# Patient Record
Sex: Female | Born: 1990 | Hispanic: Yes | Marital: Married | Smoking: Never smoker
Health system: Southern US, Community
[De-identification: ages and names within clinical notes are randomized; demographics above are authoritative.]

## PROBLEM LIST (undated history)

## (undated) DIAGNOSIS — Z789 Other specified health status: Secondary | ICD-10-CM

## (undated) HISTORY — PX: NO PAST SURGERIES: SHX2092

---

## 2016-07-19 NOTE — L&D Delivery Note (Signed)
Delivery Note At 2:59 AM a viable female was delivered via Vaginal, Spontaneous Delivery (Presentation:cephalic ; ROA  ).  APGAR: 8, 9; weight pending .   Placenta status: grossly normal , intact .  Cord: 3 vessel with the following no complications: .  Cord pH: not collected due to normal delivery.  Anesthesia:  epidural Episiotomy:  none Lacerations: 2nd degree Suture Repair: vicryl Est. Blood Loss (mL):  250  Mom to postpartum.  Baby to Couplet care / Skin to Skin.  Julieanne Cotton, Medical Student with [name], MD 03/15/2017, 3:28 AM

## 2016-10-25 LAB — OB RESULTS CONSOLE VARICELLA ZOSTER ANTIBODY, IGG: Varicella: IMMUNE

## 2016-10-25 LAB — OB RESULTS CONSOLE RPR: RPR: NONREACTIVE

## 2016-10-25 LAB — OB RESULTS CONSOLE HGB/HCT, BLOOD
HCT: 34 %
Hemoglobin: 11 g/dL

## 2016-10-25 LAB — CYTOLOGY - PAP
CYSTIC FIBROSIS PROFILE: NEGATIVE
Drug Screen, Urine: NEGATIVE
GLUCOSE 1 HR PRENATAL, POC: 131 mg/dL
Pap: NEGATIVE
URINE CULTURE, OB: NEGATIVE

## 2016-10-25 LAB — OB RESULTS CONSOLE HEPATITIS B SURFACE ANTIGEN: Hepatitis B Surface Ag: NEGATIVE

## 2016-10-25 LAB — OB RESULTS CONSOLE PLATELET COUNT: PLATELETS: 309 10*3/uL

## 2016-10-25 LAB — OB RESULTS CONSOLE ANTIBODY SCREEN: Antibody Screen: NEGATIVE

## 2016-10-25 LAB — OB RESULTS CONSOLE ABO/RH: RH TYPE: POSITIVE

## 2016-10-25 LAB — OB RESULTS CONSOLE RUBELLA ANTIBODY, IGM: RUBELLA: IMMUNE

## 2016-10-25 LAB — OB RESULTS CONSOLE GC/CHLAMYDIA
Chlamydia: NEGATIVE
Gonorrhea: NEGATIVE

## 2016-10-25 LAB — OB RESULTS CONSOLE HIV ANTIBODY (ROUTINE TESTING): HIV: NONREACTIVE

## 2016-10-28 ENCOUNTER — Other Ambulatory Visit: Payer: Self-pay | Admitting: Obstetrics and Gynecology

## 2016-10-28 DIAGNOSIS — O4402 Placenta previa specified as without hemorrhage, second trimester: Secondary | ICD-10-CM

## 2016-10-28 DIAGNOSIS — Z3689 Encounter for other specified antenatal screening: Secondary | ICD-10-CM

## 2016-10-28 DIAGNOSIS — Z3A21 21 weeks gestation of pregnancy: Secondary | ICD-10-CM

## 2016-10-28 DIAGNOSIS — O26872 Cervical shortening, second trimester: Secondary | ICD-10-CM

## 2016-11-04 ENCOUNTER — Encounter: Payer: Self-pay | Admitting: *Deleted

## 2016-11-04 ENCOUNTER — Ambulatory Visit (INDEPENDENT_AMBULATORY_CARE_PROVIDER_SITE_OTHER): Payer: Medicaid Other | Admitting: Family Medicine

## 2016-11-04 ENCOUNTER — Encounter (HOSPITAL_COMMUNITY): Payer: Self-pay

## 2016-11-04 VITALS — BP 120/68 | HR 78 | Ht 62.0 in | Wt 141.7 lb

## 2016-11-04 DIAGNOSIS — O0992 Supervision of high risk pregnancy, unspecified, second trimester: Secondary | ICD-10-CM

## 2016-11-04 DIAGNOSIS — O26872 Cervical shortening, second trimester: Secondary | ICD-10-CM | POA: Diagnosis not present

## 2016-11-04 DIAGNOSIS — O0990 Supervision of high risk pregnancy, unspecified, unspecified trimester: Secondary | ICD-10-CM | POA: Insufficient documentation

## 2016-11-04 DIAGNOSIS — O26879 Cervical shortening, unspecified trimester: Secondary | ICD-10-CM

## 2016-11-04 MED ORDER — PROGESTERONE MICRONIZED 100 MG PO CAPS: 200.0000 mg | ORAL_CAPSULE | Freq: Every day | ORAL | 6 refills | Status: DC

## 2016-11-04 NOTE — Addendum Note (Signed)
Addended by: Sherre Lain A on: 11/04/2016 03:12 PM   Modules accepted: Orders

## 2016-11-04 NOTE — Progress Notes (Signed)
   PRENATAL VISIT NOTE  Subjective:  Sara Price is a 26 y.o. G1P0 at [redacted]w[redacted]d being seen today for Initial prenatal care. She was initially seen at [facility] and was transferred here after US showed cervical length of 2.9 with funneling. She was started on prometrium  PO BID. She is currently monitored for the following issues for this high-risk pregnancy and has Supervision of high risk pregnancy, antepartum and Short cervix affecting pregnancy on her problem list.  Patient reports no complaints.  Contractions: Not present. Vag. Bleeding: None.  Movement: Present. Denies leaking of fluid.   The following portions of the patient's history were reviewed and updated as appropriate: allergies, current medications, past family history, past medical history, past social history, past surgical history and problem list. Problem list updated.  Objective:   Vitals:   11/04/16 1420 11/04/16 1425  BP: 120/68   Pulse: 78   Weight: 141 lb 11.2 oz (64.3 kg)   Height:   (1.575 m)    Fetal Status: Fetal Heart Rate (bpm): 144   Movement: Present     General:  Alert, oriented and cooperative. Patient is in no acute distress.  Skin: Skin is warm and dry. No rash noted.   Cardiovascular: Normal heart rate noted  Respiratory: Normal respiratory effort, no problems with respiration noted  Abdomen: Soft, gravid, appropriate for gestational age. Pain/Pressure: Absent     Pelvic:  Cervical exam deferred        Extremities: Normal range of motion.  Edema: None  Mental Status: Normal mood and affect. Normal behavior. Normal judgment and thought content.   Assessment and Plan:  Pregnancy: G1P0 at [redacted]w[redacted]d  1. Supervision of high risk pregnancy, antepartum FH and FHT normal.  2. Short cervix affecting pregnancy Has [place] tomorrow. prometrium  daily  Preterm labor symptoms and general obstetric precautions including but not limited to vaginal bleeding, contractions, leaking of fluid and fetal  movement were reviewed in detail with the patient. Please refer to After Visit Summary for other counseling recommendations.  Return in about 4 weeks (around 12/02/2016) for HR OB f/u.   Levie Heritage, DO

## 2016-11-05 ENCOUNTER — Encounter (HOSPITAL_COMMUNITY): Payer: Self-pay

## 2016-11-05 ENCOUNTER — Ambulatory Visit (HOSPITAL_COMMUNITY)
Admission: RE | Admit: 2016-11-05 | Discharge: 2016-11-05 | Disposition: A | Discharge status: Home / Self Care | Payer: Self-pay | Source: Ambulatory Visit | Attending: Nurse Practitioner | Admitting: Nurse Practitioner

## 2016-11-05 DIAGNOSIS — Z3A21 21 weeks gestation of pregnancy: Secondary | ICD-10-CM | POA: Insufficient documentation

## 2016-11-05 DIAGNOSIS — Z3689 Encounter for other specified antenatal screening: Secondary | ICD-10-CM | POA: Insufficient documentation

## 2016-11-05 DIAGNOSIS — O4402 Placenta previa specified as without hemorrhage, second trimester: Secondary | ICD-10-CM

## 2016-11-05 DIAGNOSIS — O26872 Cervical shortening, second trimester: Secondary | ICD-10-CM | POA: Insufficient documentation

## 2016-11-05 HISTORY — DX: Other specified health status: Z78.9

## 2016-11-05 NOTE — Addendum Note (Signed)
Encounter addended by: Levonne Hubert on: 11/05/2016  4:27 PM<BR>    Actions taken: Imaging Exam ended

## 2016-11-08 ENCOUNTER — Other Ambulatory Visit (HOSPITAL_COMMUNITY): Payer: Self-pay | Admitting: *Deleted

## 2016-11-08 DIAGNOSIS — O09219 Supervision of pregnancy with history of pre-term labor, unspecified trimester: Secondary | ICD-10-CM

## 2016-11-09 ENCOUNTER — Telehealth: Payer: Self-pay | Admitting: *Deleted

## 2016-11-09 LAB — AFP, QUAD SCREEN
DIA MOM VALUE: 0.73
DIA Value (EIA): 182.05 pg/mL
DSR (BY AGE) 1 IN: 930
DSR (SECOND TRIMESTER) 1 IN: 10000
Gestational Age: 21.4 WEEKS
MSAFP MOM: 1.11
MSAFP: 77.8 ng/mL
MSHCG Mom: 0.43
MSHCG: 10124 m[IU]/mL
Maternal Age At EDD: 26.9 yr
OSB RISK: 8647
T18 (By Age): 1:3625 {titer}
TEST RESULTS AFP: NEGATIVE
UE3 VALUE: 2.34 ng/mL
WEIGHT: 141 [lb_av]
uE3 Mom: 1.05

## 2016-11-09 NOTE — Telephone Encounter (Addendum)
-----   Message from Hermina Staggers, MD sent at 11/08/2016 12:21 PM EDT ----- Please schedule follow up U/S for CL 2 weeks from 11/05/16 Thanks Casimiro Needle  Per chart review, pt has scheduled appt for MFM [place] on 5/4 @ 1530.  Pt was called with [facility] interpreter # [phone].  Message was left on her voice mail and stated that she has next [place] on 5/4 @ 1530.  She may call back if she has questions.

## 2016-11-11 ENCOUNTER — Encounter: Payer: Self-pay | Admitting: *Deleted

## 2016-11-19 ENCOUNTER — Encounter (HOSPITAL_COMMUNITY): Payer: Self-pay

## 2016-11-19 ENCOUNTER — Ambulatory Visit (HOSPITAL_COMMUNITY)
Admission: RE | Admit: 2016-11-19 | Discharge: 2016-11-19 | Disposition: A | Discharge status: Home / Self Care | Payer: Self-pay | Source: Ambulatory Visit | Attending: Nurse Practitioner | Admitting: Nurse Practitioner

## 2016-11-19 DIAGNOSIS — O26872 Cervical shortening, second trimester: Secondary | ICD-10-CM | POA: Insufficient documentation

## 2016-11-19 DIAGNOSIS — Z3A23 23 weeks gestation of pregnancy: Secondary | ICD-10-CM | POA: Insufficient documentation

## 2016-11-19 DIAGNOSIS — O09212 Supervision of pregnancy with history of pre-term labor, second trimester: Secondary | ICD-10-CM | POA: Insufficient documentation

## 2016-11-19 DIAGNOSIS — O0990 Supervision of high risk pregnancy, unspecified, unspecified trimester: Secondary | ICD-10-CM

## 2016-11-19 DIAGNOSIS — O09219 Supervision of pregnancy with history of pre-term labor, unspecified trimester: Secondary | ICD-10-CM

## 2016-11-29 DIAGNOSIS — O0990 Supervision of high risk pregnancy, unspecified, unspecified trimester: Secondary | ICD-10-CM

## 2016-11-29 DIAGNOSIS — Z789 Other specified health status: Secondary | ICD-10-CM

## 2016-11-29 DIAGNOSIS — O4400 Placenta previa specified as without hemorrhage, unspecified trimester: Secondary | ICD-10-CM | POA: Insufficient documentation

## 2016-11-30 ENCOUNTER — Ambulatory Visit (INDEPENDENT_AMBULATORY_CARE_PROVIDER_SITE_OTHER): Payer: Medicaid Other | Admitting: Obstetrics and Gynecology

## 2016-11-30 VITALS — BP 103/58 | HR 61 | Wt 147.2 lb

## 2016-11-30 DIAGNOSIS — O0992 Supervision of high risk pregnancy, unspecified, second trimester: Secondary | ICD-10-CM | POA: Diagnosis present

## 2016-11-30 DIAGNOSIS — O0990 Supervision of high risk pregnancy, unspecified, unspecified trimester: Secondary | ICD-10-CM

## 2016-11-30 NOTE — Progress Notes (Signed)
Prenatal Visit Note Date: 11/30/2016 Clinic: [facility]  Subjective:  Sara Price is a 26 y.o. G1P0 at 6543w1d being seen today for ongoing prenatal care.  She is currently monitored for the following issues for this high-risk pregnancy and has Supervision of high risk pregnancy, antepartum and Language barrier on her problem list.  Patient reports no complaints.   Contractions: Not present. Vag. Bleeding: None.  Movement: Present. Denies leaking of fluid.   The following portions of the patient's history were reviewed and updated as appropriate: allergies, current medications, past family history, past medical history, past social history, past surgical history and problem list. Problem list updated.  Objective:   Vitals:   11/30/16 0819  BP: (!) 103/58  Pulse: 61  Weight: 147 lb 3.2 oz (66.8 kg)    Fetal Status: Fetal Heart Rate (bpm): 131   Movement: Present     General:  Alert, oriented and cooperative. Patient is in no acute distress.  Skin: Skin is warm and dry. No rash noted.   Cardiovascular: Normal heart rate noted  Respiratory: Normal respiratory effort, no problems with respiration noted  Abdomen: Soft, gravid, appropriate for gestational age. Pain/Pressure: Absent     Pelvic:  Cervical exam deferred        Extremities: Normal range of motion.  Edema: None  Mental Status: Normal mood and affect. Normal behavior. Normal judgment and thought content.   Urinalysis:      Assessment and Plan:  Pregnancy: G1P0 at 5743w1d  1. Supervision of high risk pregnancy, antepartum Normal CL x2. Told her only need PRN u/s from this point forward but would keep taking the prometrium. 28wk labs nv.   Interpreter used.   Preterm labor symptoms and general obstetric precautions including but not limited to vaginal bleeding, contractions, leaking of fluid and fetal movement were reviewed in detail with the patient. Please refer to After Visit Summary for  other counseling recommendations.  Return in about 2 weeks (around 12/14/2016) for 2-3wk rob with 2hr gtt.   [place] BingPickens, Zaide Kardell, MD

## 2016-11-30 NOTE — Progress Notes (Signed)
Video Interpreter # [id number] Ana

## 2016-12-01 ENCOUNTER — Encounter: Payer: Self-pay | Admitting: *Deleted

## 2016-12-06 DIAGNOSIS — O0990 Supervision of high risk pregnancy, unspecified, unspecified trimester: Secondary | ICD-10-CM

## 2016-12-23 ENCOUNTER — Ambulatory Visit (INDEPENDENT_AMBULATORY_CARE_PROVIDER_SITE_OTHER): Payer: Self-pay | Admitting: Obstetrics & Gynecology

## 2016-12-23 VITALS — BP 108/59 | HR 61 | Wt 156.4 lb

## 2016-12-23 DIAGNOSIS — Z3403 Encounter for supervision of normal first pregnancy, third trimester: Secondary | ICD-10-CM

## 2016-12-23 DIAGNOSIS — O0990 Supervision of high risk pregnancy, unspecified, unspecified trimester: Secondary | ICD-10-CM

## 2016-12-23 DIAGNOSIS — Z23 Encounter for immunization: Secondary | ICD-10-CM

## 2016-12-23 NOTE — Patient Instructions (Signed)

## 2016-12-23 NOTE — Progress Notes (Signed)
   PRENATAL VISIT NOTE  Subjective:  Sara Price is a 26 y.o. G1P0 at 159w3d being seen today for ongoing prenatal care.  She is currently monitored for the following issues for this low-risk pregnancy and has Supervision of high risk pregnancy, antepartum and Language barrier on her problem list.  Patient reports no complaints.  Contractions: Not present. Vag. Bleeding: None.  Movement: Present. Denies leaking of fluid.   The following portions of the patient's history were reviewed and updated as appropriate: allergies, current medications, past family history, past medical history, past social history, past surgical history and problem list. Problem list updated.  Objective:   Vitals:   12/23/16 1003  BP: (!) 108/59  Pulse: 61  Weight: 156 lb 6.4 oz (70.9 kg)    Fetal Status: Fetal Heart Rate (bpm): 135 Fundal Height: 29 cm Movement: Present     General:  Alert, oriented and cooperative. Patient is in no acute distress.  Skin: Skin is warm and dry. No rash noted.   Cardiovascular: Normal heart rate noted  Respiratory: Normal respiratory effort, no problems with respiration noted  Abdomen: Soft, gravid, appropriate for gestational age. Pain/Pressure: Absent     Pelvic:  Cervical exam deferred        Extremities: Normal range of motion.  Edema: None  Mental Status: Normal mood and affect. Normal behavior. Normal judgment and thought content.   Assessment and Plan:  Pregnancy: G1P0 at 169w3d  1. Supervision of high risk pregnancy, antepartum third trimester labs - CBC - RPR - HIV antibody (with reflex) - Glucose Tolerance, 2 Hours w/1 Hour - Tdap vaccine greater than or equal to 7yo IM  Preterm labor symptoms and general obstetric precautions including but not limited to vaginal bleeding, contractions, leaking of fluid and fetal movement were reviewed in detail with the patient. Please refer to After Visit Summary for other counseling recommendations.  Return in  about 2 weeks (around 01/06/2017).   Scheryl DarterJames Roanne Haye, MD

## 2016-12-24 LAB — RPR: RPR: NONREACTIVE

## 2016-12-24 LAB — CBC
HEMOGLOBIN: 10.7 g/dL — AB (ref 11.1–15.9)
Hematocrit: 32.3 % — ABNORMAL LOW (ref 34.0–46.6)
MCH: 29.7 pg (ref 26.6–33.0)
MCHC: 33.1 g/dL (ref 31.5–35.7)
MCV: 90 fL (ref 79–97)
Platelets: 199 10*3/uL (ref 150–379)
RBC: 3.6 x10E6/uL — AB (ref 3.77–5.28)
RDW: 13.9 % (ref 12.3–15.4)
WBC: 10.5 10*3/uL (ref 3.4–10.8)

## 2016-12-24 LAB — GLUCOSE TOLERANCE, 2 HOURS W/ 1HR
GLUCOSE, 1 HOUR: 168 mg/dL (ref 65–179)
GLUCOSE, 2 HOUR: 135 mg/dL (ref 65–152)
GLUCOSE, FASTING: 76 mg/dL (ref 65–91)

## 2016-12-24 LAB — HIV ANTIBODY (ROUTINE TESTING W REFLEX): HIV Screen 4th Generation wRfx: NONREACTIVE

## 2017-01-05 ENCOUNTER — Ambulatory Visit (INDEPENDENT_AMBULATORY_CARE_PROVIDER_SITE_OTHER): Payer: Self-pay | Admitting: Obstetrics and Gynecology

## 2017-01-05 VITALS — BP 111/60 | HR 76 | Wt 158.7 lb

## 2017-01-05 DIAGNOSIS — O0993 Supervision of high risk pregnancy, unspecified, third trimester: Secondary | ICD-10-CM

## 2017-01-05 DIAGNOSIS — O0990 Supervision of high risk pregnancy, unspecified, unspecified trimester: Secondary | ICD-10-CM

## 2017-01-05 DIAGNOSIS — O26873 Cervical shortening, third trimester: Secondary | ICD-10-CM

## 2017-01-05 MED ORDER — PROGESTERONE MICRONIZED 100 MG PO CAPS: 200.0000 mg | ORAL_CAPSULE | Freq: Every day | ORAL | 1 refills | Status: DC

## 2017-01-05 NOTE — Progress Notes (Signed)
Prenatal Visit Note Date: 01/05/2017 Clinic: [facility]  Subjective:  Michiel SitesMirna Price is a 26 y.o. G1P0 at 5835w2d being seen today for ongoing prenatal care.  She is currently monitored for the following issues for this low-risk pregnancy and has Supervision of high risk pregnancy, antepartum and Language barrier on her problem list.  Patient reports no complaints.   Contractions: Not present. Vag. Bleeding: None.  Movement: Present. Denies leaking of fluid.   The following portions of the patient's history were reviewed and updated as appropriate: allergies, current medications, past family history, past medical history, past social history, past surgical history and problem list. Problem list updated.  Objective:   Vitals:   01/05/17 1603  BP: 111/60  Pulse: 76  Weight: 158 lb 11.2 oz (72 kg)    Fetal Status: Fetal Heart Rate (bpm): 143 Fundal Height: 30 cm Movement: Present     General:  Alert, oriented and cooperative. Patient is in no acute distress.  Skin: Skin is warm and dry. No rash noted.   Cardiovascular: Normal heart rate noted  Respiratory: Normal respiratory effort, no problems with respiration noted  Abdomen: Soft, gravid, appropriate for gestational age. Pain/Pressure: Absent     Pelvic:  Cervical exam deferred        Extremities: Normal range of motion.  Edema: None  Mental Status: Normal mood and affect. Normal behavior. Normal judgment and thought content.   Urinalysis:      Assessment and Plan:  Pregnancy: G1P0 at 6935w2d  1. Supervision of high risk pregnancy, antepartum Routine care. D/w pt re: BC nv.   Preterm labor symptoms and general obstetric precautions including but not limited to vaginal bleeding, contractions, leaking of fluid and fetal movement were reviewed in detail with the patient. Please refer to After Visit Summary for other counseling recommendations.  Return in about 2 weeks (around 01/19/2017) for  rob.   [place] BingPickens, Keyden Pavlov, MD

## 2017-01-20 ENCOUNTER — Ambulatory Visit (INDEPENDENT_AMBULATORY_CARE_PROVIDER_SITE_OTHER): Payer: Self-pay | Admitting: Family Medicine

## 2017-01-20 VITALS — BP 111/61 | HR 83 | Wt 162.6 lb

## 2017-01-20 DIAGNOSIS — O26879 Cervical shortening, unspecified trimester: Secondary | ICD-10-CM

## 2017-01-20 DIAGNOSIS — Z789 Other specified health status: Secondary | ICD-10-CM

## 2017-01-20 DIAGNOSIS — O0993 Supervision of high risk pregnancy, unspecified, third trimester: Secondary | ICD-10-CM

## 2017-01-20 DIAGNOSIS — O0990 Supervision of high risk pregnancy, unspecified, unspecified trimester: Secondary | ICD-10-CM

## 2017-01-20 NOTE — Progress Notes (Signed)
Educated pt on [place] Spanish interpreter [employer]

## 2017-01-20 NOTE — Progress Notes (Signed)
   PRENATAL VISIT NOTE  Subjective:  Kathrine CordsMirna Villalta-Ramos is a 26 y.o. G1P0 at 4363w3d being seen today for ongoing prenatal care.  She is currently monitored for the following issues for this high-risk pregnancy and has Supervision of high risk pregnancy, antepartum and Language barrier on her problem list.  Patient reports no complaints.  Contractions: Not present. Vag. Bleeding: None.  Movement: Present. Denies leaking of fluid.   The following portions of the patient's history were reviewed and updated as appropriate: allergies, current medications, past family history, past medical history, past social history, past surgical history and problem list. Problem list updated.  Objective:   Vitals:   01/20/17 0759  BP: 111/61  Pulse: 83  Weight: 162 lb 9.6 oz (73.8 kg)    Fetal Status: Fetal Heart Rate (bpm): 128   Movement: Present     General:  Alert, oriented and cooperative. Patient is in no acute distress.  Skin: Skin is warm and dry. No rash noted.   Cardiovascular: Normal heart rate noted  Respiratory: Normal respiratory effort, no problems with respiration noted  Abdomen: Soft, gravid, appropriate for gestational age. Pain/Pressure: Absent     Pelvic:  Cervical exam deferred        Extremities: Normal range of motion.  Edema: Trace  Mental Status: Normal mood and affect. Normal behavior. Normal judgment and thought content.   Assessment and Plan:  Pregnancy: G1P0 at 263w3d  1. Supervision of high risk pregnancy, antepartum FHT and FH normal  2. Short cervix affecting pregnancy Continue Prometrium. No further US needed.  3. Language barrier Interpreter used  Preterm labor symptoms and general obstetric precautions including but not limited to vaginal bleeding, contractions, leaking of fluid and fetal movement were reviewed in detail with the patient. Please refer to After Visit Summary for other counseling recommendations.  No Follow-up on file.   Levie HeritageJacob J Tresten Pantoja,  DO

## 2017-01-26 ENCOUNTER — Encounter: Payer: Self-pay | Admitting: Family Medicine

## 2017-02-03 ENCOUNTER — Ambulatory Visit (INDEPENDENT_AMBULATORY_CARE_PROVIDER_SITE_OTHER): Payer: Self-pay | Admitting: Obstetrics and Gynecology

## 2017-02-03 ENCOUNTER — Encounter: Payer: Self-pay | Admitting: Obstetrics and Gynecology

## 2017-02-03 VITALS — BP 103/64 | HR 72 | Wt 164.7 lb

## 2017-02-03 DIAGNOSIS — Z789 Other specified health status: Secondary | ICD-10-CM

## 2017-02-03 DIAGNOSIS — O26873 Cervical shortening, third trimester: Secondary | ICD-10-CM

## 2017-02-03 DIAGNOSIS — Z603 Acculturation difficulty: Secondary | ICD-10-CM

## 2017-02-03 MED ORDER — PROGESTERONE MICRONIZED 100 MG PO CAPS: 200.0000 mg | ORAL_CAPSULE | Freq: Every day | ORAL | 0 refills | Status: DC

## 2017-02-03 NOTE — Progress Notes (Signed)
Prenatal Visit Note Date: 02/03/2017 Clinic: [facility]  Subjective:  Sara Price is a 26 y.o. G1P0 at 896w3d being seen today for ongoing prenatal care.  She is currently monitored for the following issues for this high-risk pregnancy and has Supervision of high risk pregnancy, antepartum; Language barrier; and Short cervical length during pregnancy in third trimester on her problem list.  Patient reports no complaints.   Contractions: Not present. Vag. Bleeding: None.  Movement: Present. Denies leaking of fluid.   The following portions of the patient's history were reviewed and updated as appropriate: allergies, current medications, past family history, past medical history, past social history, past surgical history and problem list. Problem list updated.  Objective:   Vitals:   02/03/17 0754  BP: 103/64  Pulse: 72  Weight: 164 lb 11.2 oz (74.7 kg)    Fetal Status: Fetal Heart Rate (bpm): 131 Fundal Height: 34 cm Movement: Present  Presentation: Vertex  General:  Alert, oriented and cooperative. Patient is in no acute distress.  Skin: Skin is warm and dry. No rash noted.   Cardiovascular: Normal heart rate noted  Respiratory: Normal respiratory effort, no problems with respiration noted  Abdomen: Soft, gravid, appropriate for gestational age. Pain/Pressure: Absent     Pelvic:  Cervical exam deferred        Extremities: Normal range of motion.  Edema: None  Mental Status: Normal mood and affect. Normal behavior. Normal judgment and thought content.   Urinalysis:      Assessment and Plan:  Pregnancy: G1P0 at 736w3d  1. Short cervical length during pregnancy in third trimester Continue qhs protmetrium GBS nv. D/w pt re: BC nv  2. Language barrier Interpreter used  Preterm labor symptoms and general obstetric precautions including but not limited to vaginal bleeding, contractions, leaking of fluid and fetal movement were reviewed in detail with  the patient. Please refer to After Visit Summary for other counseling recommendations.  Return in about 2 weeks (around 02/17/2017) for rob.   [place] BingPickens, Shekina Cordell, MD

## 2017-02-03 NOTE — Progress Notes (Signed)
Spanish Interpreter Raquel Carrolyn MeiersMora Educated pt on Skin to Skin

## 2017-02-18 ENCOUNTER — Ambulatory Visit (INDEPENDENT_AMBULATORY_CARE_PROVIDER_SITE_OTHER): Payer: Self-pay | Admitting: Obstetrics and Gynecology

## 2017-02-18 VITALS — BP 111/67 | HR 67 | Wt 169.7 lb

## 2017-02-18 DIAGNOSIS — Z113 Encounter for screening for infections with a predominantly sexual mode of transmission: Secondary | ICD-10-CM

## 2017-02-18 DIAGNOSIS — Z789 Other specified health status: Secondary | ICD-10-CM

## 2017-02-18 DIAGNOSIS — O0993 Supervision of high risk pregnancy, unspecified, third trimester: Secondary | ICD-10-CM

## 2017-02-18 DIAGNOSIS — O0990 Supervision of high risk pregnancy, unspecified, unspecified trimester: Secondary | ICD-10-CM

## 2017-02-18 DIAGNOSIS — O26873 Cervical shortening, third trimester: Secondary | ICD-10-CM

## 2017-02-18 LAB — OB RESULTS CONSOLE GBS: GBS: NEGATIVE

## 2017-02-18 NOTE — Progress Notes (Signed)
Subjective:  Sara Price is a 26 y.o. G1P0 at 3623w4d being seen today for ongoing prenatal care.  She is currently monitored for the following issues for this high-risk pregnancy and has Supervision of high risk pregnancy, antepartum; Language barrier; and Short cervical length during pregnancy in third trimester on her problem list.  Patient reports no complaints.  Contractions: Not present. Vag. Bleeding: None.  Movement: Present. Denies leaking of fluid.   The following portions of the patient's history were reviewed and updated as appropriate: allergies, current medications, past family history, past medical history, past social history, past surgical history and problem list. Problem list updated.  Objective:   Vitals:   02/18/17 0823  BP: 111/67  Pulse: 67  Weight: 169 lb 11.2 oz (77 kg)    Fetal Status: Fetal Heart Rate (bpm): 133   Movement: Present     General:  Alert, oriented and cooperative. Patient is in no acute distress.  Skin: Skin is warm and dry. No rash noted.   Cardiovascular: Normal heart rate noted  Respiratory: Normal respiratory effort, no problems with respiration noted  Abdomen: Soft, gravid, appropriate for gestational age. Pain/Pressure: Absent     Pelvic:  Cervical exam performed        Extremities: Normal range of motion.  Edema: None  Mental Status: Normal mood and affect. Normal behavior. Normal judgment and thought content.   Urinalysis:      Assessment and Plan:  Pregnancy: G1P0 at 1123w4d  1. Supervision of high risk pregnancy, antepartum, third trimester Labor precautions - Culture, beta strep (group b only) - GC[facility]Chlamydia probe amp [facility]  2. Short cervical length during pregnancy in third trimester Will stop Progesterone at 37 weeks  4. Language barrier Interrupter service used today  Preterm labor symptoms and general obstetric precautions including but not limited to vaginal bleeding, contractions, leaking of  fluid and fetal movement were reviewed in detail with the patient. Please refer to After Visit Summary for other counseling recommendations.  Return in about 1 week (around 02/25/2017) for OB visit.   [place]ermina StaggersErvin, Maryori Weide L, MD

## 2017-02-18 NOTE — Progress Notes (Signed)
[employer] interpreter Juan [phone]

## 2017-02-18 NOTE — Patient Instructions (Signed)
Parto vaginal (Vaginal Delivery) Durante el parto, el mdico la ayudar a dar a luz a su beb. En elparto vaginal, deber pujar para que el beb salga por la vagina. Sin embargo, antes de que pueda sacar al beb, es necesario que ocurran ciertas cosas. La abertura del tero (cuello del tero) tiene que ablandarse, hacerse ms delgado y abrirse (dilatar) hasta que llegue a 10 cm. Adems, el beb tiene que bajar desde el tero a la vagina. SIGNOS DE TRABAJO DE PARTO  El mdico tendr primero que asegurarse de que usted est en trabajo de parto. Algunos signos son:   Eliminar lo que se llama tapn mucoso antes del inicio del trabajo de parto. Este es una pequea cantidad de mucosidad teida con sangre.  Tener contracciones uterinas regulares y dolorosas.   El tiempo entre las contracciones debe acortarse  Las molestias y el dolor se harn ms intensos gradualmente.  El dolor de las contracciones empeora al caminar y no se alivia con el reposo.   El cuello del tero se hace mas delgado (se borra) y se dilata. ANTES DEL PARTO Una vez que se inicie el trabajo de parto y sea admitida en el hospital o sanatorio, el mdico podr hacer lo siguiente:   Realizar un examen fsico.  Controlar si hay complicaciones relacionadas con el trabajo de parto.  Verificar su presin arterial, temperatura y pulso y la frecuencia cardaca (signos vitales).   Determinar si se ha roto el saco amnitico y cundo ha ocurrido.  Realizar un examen vaginal (utilizando un guante estril y un lubricante) para determinar: ? La posicin (presentacin) del beb. El beb se presenta con la cabeza primero (vertex) en el canal de parto (vagina), o estn los pies o las nalgas primero (de nalgas)? ? El nivel (estacin) de la cabeza del beb dentro del canal de parto. ? El borramiento y la dilatacin del cuello uterino  El monitor fetal electrnico generalmente se coloca sobre el abdomen al llegar. Se utiliza para  controlar las contracciones y la frecuencia cardaca del beb. ? Cuando el monitor est en el abdomen (monitor fetal externo), slo toma la frecuencia y la duracin de las contracciones. No informa acerca de la intensidad de las contracciones. ? Si el mdico necesita saber exactamente la intensidad de las contracciones o cul es la frecuencia cardaca del beb, colocar un monitor interno en la vagina y el tero. El mdico comentar los riesgos y los beneficios de usar un monitor interno y le pedir autorizacin antes de colocar el dispositivo. ? El monitoreo fetal continuo ser necesario si le han aplicado una epidural, si le administran ciertos medicamentos (como oxitocina) y si tiene complicaciones del embarazo o del trabajo de parto.  Podrn colocarle una va intravenosa en una vena del brazo para suministrarle lquidos y medicamentos, si es necesario. TRES ETAPAS DEL TRABAJO DE PARTO Y EL PARTO El trabajo de parto y el parto normales se dividen en tres etapas. Primera etapa Esta etapa comienza cuando comienzan las contracciones regulares y el cuello comienza a borrarse y dilatarse. Finaliza cuando el cuello est completamente abierto (completamente dilatado). La primera etapa es la etapa ms larga del trabajo de parto y puede durar desde 3 horas a 15 horas.  Algunos mtodos estn disponibles para ayudar con el dolor del parto. Usted y su mdico decidirn qu opcin es la mejor para usted. Las opciones incluyen:   Medicamentos narcticos. Estos son medicamentos fuertes que usted puede recibir a travs de una va intravenosa o   como inyeccin en el msculo. Estos medicamentos alivian el dolor pero no hacen que desaparezca completamente.  Epidural. Se administra un medicamento a travs de un tubo delgado que se inserta en la espalda. El medicamento adormece la parte inferior del cuerpo y evita el dolor en esa zona.  Bloqueo paracervical Es una inyeccin de un anestsico en cada lado del cuello  uterino.  Usted podr pedir un parto natural, que implica que no se usen analgsicos ni epidural durante el parto y el trabajo de parto. En cambio, podr tener otro tipo de ayuda como ejercicios respiratorios para hacer frente al dolor. Segunda etapa La segunda etapa del trabajo de parto comienza cuando el cuello se ha dilatado completamente a 10 cm. Contina hasta que usted puja al beb hacia abajo, por el canal de parto, y el beb nace. Esta etapa puede durar slo algunos minutos o algunas horas.  La posicin del la cabeza del beb a medida que pasa por el canal de parto, es informada como un nmero, llamado estacin. Si la cabeza del beb no ha iniciado su descenso, la estacin se describe como que est en menos 3 (-3). Cuando la cabeza del beb est en la estacin cero, est en el medio del canal de parto y se encaja en la pelvis. La estacin en la que se encuentra el beb indica el progreso de la segunda etapa del trabajo de parto.  Cuando el beb nace, el mdico lo sostendr con la cabeza hacia abajo para evitar que el lquido amnitico, el moco y la sangre entren en los pulmones del beb. La boca y la nariz del beb podrn ser succionadas con un pequeo bulbo para retirar todo lquido adicional.  El mdico podr colocar al beb sobre su estmago. Es importante evitar que el beb tome fro. Para hacerlo, el mdico secar al beb, lo colocar directamente sobre su piel, (sin mantas entre usted y el beb) y lo cubrir con mantas secas y tibias.  Se corta el cordn umbilical. Tercera etapa Durante la tercera etapa del trabajo de parto, el mdico sacar la placenta (alumbramiento) y se asegurar de que el sangrado est controlado. La salida de la placenta generalmente demora 5 minutos pero puede tardar hasta 30 minutos. Luego de la salida de la placenta, le darn un medicamento por va intravenosa o inyectable para ayudar a contraer el tero y controlar el sangrado. Si planea amamantar al beb,  puede intentar en este momento Luego de la salida de la placenta, el tero debe contraerse y quedar muy firme. Si el tero no queda firme, el mdico lo masajear. Esto es importante debido a que la contraccin del tero ayuda a cortar el sangrado en el sitio en que la placenta estaba unida al tero. Si el tero no se contrae adecuadamente ni permanece firme, podr causar un sangrado abundante. Si hay mucho sangrado, podrn darle medicamentos para contraer el tero y detener el sangrado.  Esta informacin no tiene como fin reemplazar el consejo del mdico. Asegrese de hacerle al mdico cualquier pregunta que tenga. Document Released: 06/17/2008 Document Revised: 07/26/2014 Elsevier Interactive Patient Education  2017 [employer]vier Inc.  

## 2017-02-21 LAB — GC/CHLAMYDIA PROBE AMP (~~LOC~~) NOT AT ARMC
Chlamydia: NEGATIVE
NEISSERIA GONORRHEA: NEGATIVE

## 2017-02-22 LAB — CULTURE, BETA STREP (GROUP B ONLY): Strep Gp B Culture: NEGATIVE

## 2017-02-28 ENCOUNTER — Ambulatory Visit (INDEPENDENT_AMBULATORY_CARE_PROVIDER_SITE_OTHER): Payer: Self-pay | Admitting: Family Medicine

## 2017-02-28 VITALS — BP 114/64 | HR 59 | Wt 172.7 lb

## 2017-02-28 DIAGNOSIS — Z789 Other specified health status: Secondary | ICD-10-CM

## 2017-02-28 DIAGNOSIS — O0993 Supervision of high risk pregnancy, unspecified, third trimester: Secondary | ICD-10-CM

## 2017-02-28 NOTE — Progress Notes (Signed)
Video Interpreter # [id number]

## 2017-02-28 NOTE — Progress Notes (Signed)
    PRENATAL VISIT NOTE  Subjective:  Sara Price is a 26 y.o. G1P0 at 2846w0d being seen today for ongoing prenatal care.  She is currently monitored for the following issues for this low-risk pregnancy and has Supervision of high risk pregnancy, antepartum; Language barrier; and Short cervical length during pregnancy in third trimester on her problem list.  Patient reports no complaints.  Contractions: Not present. Vag. Bleeding: None.  Movement: Present. Denies leaking of fluid.   The following portions of the patient's history were reviewed and updated as appropriate: allergies, current medications, past family history, past medical history, past social history, past surgical history and problem list. Problem list updated.  Objective:   Vitals:   02/28/17 1522  BP: 114/64  Pulse: (!) 59  Weight: 172 lb 11.2 oz (78.3 kg)    Fetal Status: Fetal Heart Rate (bpm): 130   Movement: Present     General:  Alert, oriented and cooperative. Patient is in no acute distress.  Skin: Skin is warm and dry. No rash noted.   Cardiovascular: Normal heart rate noted  Respiratory: Normal respiratory effort, no problems with respiration noted  Abdomen: Soft, gravid, appropriate for gestational age.  Pain/Pressure: Present     Pelvic: Cervical exam deferred        Extremities: Normal range of motion.  Edema: None  Mental Status:  Normal mood and affect. Normal behavior. Normal judgment and thought content.   Assessment and Plan:  Pregnancy: G1P0 at 8346w0d  1. Supervision of high risk pregnancy, antepartum, third trimester FHT and FH normal  2. Language barrier Interpreter used.  Term labor symptoms and general obstetric precautions including but not limited to vaginal bleeding, contractions, leaking of fluid and fetal movement were reviewed in detail with the patient. Please refer to After Visit Summary for other counseling recommendations.  Return in about 1 week (around 03/07/2017) for  OB f/u.   Levie HeritageJacob J Stinson, DO

## 2017-03-08 ENCOUNTER — Ambulatory Visit (INDEPENDENT_AMBULATORY_CARE_PROVIDER_SITE_OTHER): Payer: Self-pay | Admitting: Obstetrics and Gynecology

## 2017-03-08 VITALS — BP 110/66 | HR 63 | Wt 175.7 lb

## 2017-03-08 DIAGNOSIS — O0993 Supervision of high risk pregnancy, unspecified, third trimester: Secondary | ICD-10-CM

## 2017-03-08 DIAGNOSIS — O0990 Supervision of high risk pregnancy, unspecified, unspecified trimester: Secondary | ICD-10-CM

## 2017-03-08 DIAGNOSIS — Z789 Other specified health status: Secondary | ICD-10-CM

## 2017-03-08 DIAGNOSIS — O26873 Cervical shortening, third trimester: Secondary | ICD-10-CM

## 2017-03-08 NOTE — Progress Notes (Signed)
[employer] interpreter Casimiro Needle [phone]

## 2017-03-08 NOTE — Progress Notes (Signed)
Prenatal Visit Note Date: 03/08/2017 Clinic: [facility]  Subjective:  Sara Price is a 26 y.o. G1P0 at [redacted]w[redacted]d being seen today for ongoing prenatal care.  She is currently monitored for the following issues for this high-risk pregnancy and has Supervision of high risk pregnancy, antepartum; Language barrier; and Short cervical length during pregnancy in third trimester on her problem list.  Patient reports no complaints.   Contractions: Not present. Vag. Bleeding: None.  Movement: Present. Denies leaking of fluid.   The following portions of the patient's history were reviewed and updated as appropriate: allergies, current medications, past family history, past medical history, past social history, past surgical history and problem list. Problem list updated.  Objective:   Vitals:   03/08/17 1500  BP: 110/66  Pulse: 63  Weight: 175 lb 11.2 oz (79.7 kg)    Fetal Status: Fetal Heart Rate (bpm): 128 Fundal Height: 38 cm Movement: Present  Presentation: Vertex  General:  Alert, oriented and cooperative. Patient is in no acute distress.  Skin: Skin is warm and dry. No rash noted.   Cardiovascular: Normal heart rate noted  Respiratory: Normal respiratory effort, no problems with respiration noted  Abdomen: Soft, gravid, appropriate for gestational age. Pain/Pressure: Absent     Pelvic:  Cervical exam performed Dilation: Fingertip Effacement (%): 50    Extremities: Normal range of motion.  Edema: None  Mental Status: Normal mood and affect. Normal behavior. Normal judgment and thought content.   Urinalysis:      Assessment and Plan:  Pregnancy: G1P0 at [redacted]w[redacted]d  1. Supervision of high risk pregnancy, antepartum Routine care. Set up PDIOL nv  2. Short cervical length during pregnancy in third trimester  3. Language barrier Interpreter used  Preterm labor symptoms and general obstetric precautions including but not limited to vaginal bleeding,  contractions, leaking of fluid and fetal movement were reviewed in detail with the patient. Please refer to After Visit Summary for other counseling recommendations.  Return in about 1 week (around 03/15/2017) for rob.   Emigrant Bing, MD

## 2017-03-14 ENCOUNTER — Inpatient Hospital Stay (HOSPITAL_COMMUNITY): Payer: Medicaid Other | Admitting: Anesthesiology

## 2017-03-14 ENCOUNTER — Encounter (HOSPITAL_COMMUNITY): Payer: Self-pay | Admitting: *Deleted

## 2017-03-14 ENCOUNTER — Inpatient Hospital Stay (HOSPITAL_COMMUNITY)
Admission: AD | Admit: 2017-03-14 | Discharge: 2017-03-14 | Disposition: A | Discharge status: Home / Self Care | Payer: Medicaid Other | Source: Ambulatory Visit | Attending: Obstetrics and Gynecology | Admitting: Obstetrics and Gynecology

## 2017-03-14 ENCOUNTER — Inpatient Hospital Stay (HOSPITAL_COMMUNITY)
Admission: AD | Admit: 2017-03-14 | Discharge: 2017-03-17 | DRG: 775 | Disposition: A | Discharge status: Home / Self Care | Payer: Medicaid Other | Source: Ambulatory Visit | Attending: Obstetrics & Gynecology | Admitting: Obstetrics & Gynecology

## 2017-03-14 DIAGNOSIS — Z789 Other specified health status: Secondary | ICD-10-CM

## 2017-03-14 DIAGNOSIS — Z880 Allergy status to penicillin: Secondary | ICD-10-CM

## 2017-03-14 DIAGNOSIS — Z3A40 40 weeks gestation of pregnancy: Secondary | ICD-10-CM

## 2017-03-14 DIAGNOSIS — O4292 Full-term premature rupture of membranes, unspecified as to length of time between rupture and onset of labor: Principal | ICD-10-CM | POA: Diagnosis present

## 2017-03-14 DIAGNOSIS — O0990 Supervision of high risk pregnancy, unspecified, unspecified trimester: Secondary | ICD-10-CM

## 2017-03-14 DIAGNOSIS — O479 False labor, unspecified: Secondary | ICD-10-CM

## 2017-03-14 LAB — CBC
HCT: 34.5 % — ABNORMAL LOW (ref 36.0–46.0)
HEMOGLOBIN: 11.8 g/dL — AB (ref 12.0–15.0)
MCH: 29.5 pg (ref 26.0–34.0)
MCHC: 34.2 g/dL (ref 30.0–36.0)
MCV: 86.3 fL (ref 78.0–100.0)
Platelets: 218 10*3/uL (ref 150–400)
RBC: 4 MIL/uL (ref 3.87–5.11)
RDW: 14.7 % (ref 11.5–15.5)
WBC: 11.8 10*3/uL — AB (ref 4.0–10.5)

## 2017-03-14 LAB — TYPE AND SCREEN
ABO/RH(D): O POS
Antibody Screen: NEGATIVE

## 2017-03-14 LAB — POCT FERN TEST: POCT FERN TEST: POSITIVE

## 2017-03-14 LAB — ABO/RH: ABO/RH(D): O POS

## 2017-03-14 MED ORDER — LIDOCAINE HCL (PF) 1 % IJ SOLN
INTRAMUSCULAR | Status: DC | PRN
  Administered 2017-03-14: 6 mL via EPIDURAL
  Administered 2017-03-14: 4 mL

## 2017-03-14 MED ORDER — FENTANYL CITRATE (PF) 100 MCG/2ML IJ SOLN
100.0000 ug | INTRAMUSCULAR | Status: DC | PRN
  Administered 2017-03-14 (×3): 100 ug via INTRAVENOUS
  Filled 2017-03-14 (×3): qty 2

## 2017-03-14 MED ORDER — OXYCODONE-ACETAMINOPHEN 5-325 MG PO TABS: 1.0000 | ORAL_TABLET | ORAL | Status: DC | PRN

## 2017-03-14 MED ORDER — TERBUTALINE SULFATE 1 MG/ML IJ SOLN: 0.2500 mg | Freq: Once | INTRAMUSCULAR | Status: DC | PRN

## 2017-03-14 MED ORDER — FENTANYL 2.5 MCG/ML BUPIVACAINE 1/10 % EPIDURAL INFUSION (WH - ANES)
14.0000 mL/h | INTRAMUSCULAR | Status: DC | PRN
  Administered 2017-03-14: 14 mL/h via EPIDURAL
  Filled 2017-03-14: qty 100

## 2017-03-14 MED ORDER — LACTATED RINGERS IV SOLN: 500.0000 mL | Freq: Once | INTRAVENOUS | Status: DC

## 2017-03-14 MED ORDER — OXYTOCIN 40 UNITS IN LACTATED RINGERS INFUSION - SIMPLE MED
1.0000 m[IU]/min | INTRAVENOUS | Status: DC
  Administered 2017-03-14: 2 m[IU]/min via INTRAVENOUS
  Administered 2017-03-15: 6 m[IU]/min via INTRAVENOUS
  Filled 2017-03-14: qty 1000

## 2017-03-14 MED ORDER — PHENYLEPHRINE 40 MCG/ML (10ML) SYRINGE FOR IV PUSH (FOR BLOOD PRESSURE SUPPORT): 80.0000 ug | PREFILLED_SYRINGE | INTRAVENOUS | Status: DC | PRN

## 2017-03-14 MED ORDER — ACETAMINOPHEN 325 MG PO TABS: 650.0000 mg | ORAL_TABLET | ORAL | Status: DC | PRN

## 2017-03-14 MED ORDER — SOD CITRATE-CITRIC ACID 500-334 MG/5ML PO SOLN: 30.0000 mL | ORAL | Status: DC | PRN

## 2017-03-14 MED ORDER — LACTATED RINGERS IV SOLN: 500.0000 mL | INTRAVENOUS | Status: DC | PRN

## 2017-03-14 MED ORDER — ONDANSETRON HCL 4 MG/2ML IJ SOLN: 4.0000 mg | Freq: Four times a day (QID) | INTRAMUSCULAR | Status: DC | PRN

## 2017-03-14 MED ORDER — LIDOCAINE HCL (PF) 1 % IJ SOLN
30.0000 mL | INTRAMUSCULAR | Status: DC | PRN
  Filled 2017-03-14: qty 30

## 2017-03-14 MED ORDER — OXYTOCIN BOLUS FROM INFUSION
500.0000 mL | Freq: Once | INTRAVENOUS | Status: AC
  Administered 2017-03-15: 500 mL via INTRAVENOUS

## 2017-03-14 MED ORDER — DIPHENHYDRAMINE HCL 50 MG/ML IJ SOLN: 12.5000 mg | INTRAMUSCULAR | Status: DC | PRN

## 2017-03-14 MED ORDER — OXYTOCIN 40 UNITS IN LACTATED RINGERS INFUSION - SIMPLE MED
2.5000 [IU]/h | INTRAVENOUS | Status: DC
  Administered 2017-03-15: 2.5 [IU]/h via INTRAVENOUS

## 2017-03-14 MED ORDER — PHENYLEPHRINE 40 MCG/ML (10ML) SYRINGE FOR IV PUSH (FOR BLOOD PRESSURE SUPPORT)
80.0000 ug | PREFILLED_SYRINGE | INTRAVENOUS | Status: DC | PRN
  Filled 2017-03-14: qty 10

## 2017-03-14 MED ORDER — OXYCODONE-ACETAMINOPHEN 5-325 MG PO TABS: 2.0000 | ORAL_TABLET | ORAL | Status: DC | PRN

## 2017-03-14 MED ORDER — EPHEDRINE 5 MG/ML INJ: 10.0000 mg | INTRAVENOUS | Status: DC | PRN

## 2017-03-14 MED ORDER — LACTATED RINGERS IV SOLN
INTRAVENOUS | Status: DC
  Administered 2017-03-14 – 2017-03-15 (×2): via INTRAVENOUS

## 2017-03-14 NOTE — MAU Note (Signed)
Pt was in earlier and pain has now gotten worse, 8/10.  No LOF and not currently bleeding.

## 2017-03-14 NOTE — H&P (Signed)
Sara Price is a 26 y.o. female admitted for spontaneous rupture of membranes with early labor.   Clinic  [facility] Prenatal Labs  Dating   Blood type: O/Positive/-- (04/09 0000)   Genetic Screen  Quad:  normal Antibody:Negative (04/09 0000)  Anatomic [place]   Rubella: Immune (04/09 0000)  GTT Early:  131             Third trimester: WNL RPR: Nonreactive (04/09 0000)   Flu vaccine declined HBsAg: Negative (04/09 0000)   TDaP vaccine    12/23/2016                                           Rhogam: HIV: Non-reactive (04/09 0000)   Baby Food  Breastfeed                                             GBS: (For PCN allergy, check sensitivities)  Contraception  unsure Pap:  Circumcision no   Pediatrician    Support Person  FOB: Pablo     OB History    Gravida Para Term Preterm AB Living   1         0   SAB TAB Ectopic Multiple Live Births                 Past Medical History:  Diagnosis Date  . Medical history non-contributory    Past Surgical History:  Procedure Laterality Date  . NO PAST SURGERIES     Family History: family history includes Diabetes in her father. Social History:  reports that she has never smoked. She has never used smokeless tobacco. She reports that she does not drink alcohol or use drugs.     Maternal Diabetes: No Genetic Screening: Normal Maternal Ultrasounds/Referrals: Normal Fetal Ultrasounds or other Referrals:  None Maternal Substance Abuse:  No Significant Maternal Medications:  None Significant Maternal Lab Results:  None Other Comments:  None  Review of Systems  Constitutional: Negative for chills and fever.  Eyes: Negative for blurred vision.  Respiratory: Negative for cough and shortness of breath.   Cardiovascular: Negative for chest pain.  Gastrointestinal: Positive for abdominal pain. Negative for nausea and vomiting.  Genitourinary: Negative for dysuria.  Neurological: Negative for dizziness and headaches.   Psychiatric/Behavioral: Negative for depression. The patient is not nervous/anxious.    Maternal Medical History:  Reason for admission: Rupture of membranes and contractions.  Nausea.  Contractions: Onset was 13-24 hours ago.   Frequency: regular.   Duration is approximately 60 seconds.   Perceived severity is strong.    Fetal activity: Perceived fetal activity is normal.   Last perceived fetal movement was within the past hour.    Prenatal complications: no prenatal complications Prenatal Complications - Diabetes: none.    Dilation: 2.5 Effacement (%): 90 Station: -3 Exam by:: Ginnie Smart RN Blood pressure 126/81, pulse (!) 57, temperature 97.8 F (36.6 C), resp. rate 18, last menstrual period 06/07/2016.    Maternal Exam:  Uterine Assessment: Contraction strength is firm.  Contraction frequency is regular.   Abdomen: Estimated fetal weight is normal GTT.   Fetal presentation: vertex  Introitus: Ferning test: positive.  Nitrazine test: positive.  Pelvis: adequate for delivery.   Cervix: Cervix evaluated by digital  exam.     Physical Exam  Constitutional: She is oriented to person, place, and time. She appears well-developed and well-nourished. No distress.  HENT:  Head: Normocephalic.  Eyes: Conjunctivae are normal.  Neck: Normal range of motion. Neck supple.  Cardiovascular: Normal rate and regular rhythm.   No murmur heard. Respiratory: Effort normal and breath sounds normal. She has no wheezes. She has no rales.  GI: Soft.  Musculoskeletal: Normal range of motion. She exhibits no edema.  Neurological: She is alert and oriented to person, place, and time.  Skin: Skin is warm and dry.  Psychiatric: She has a normal mood and affect.    Prenatal labs: ABO, Rh: O/Positive/-- (04/09 0000) Antibody: Negative (04/09 0000) Rubella: Immune (04/09 0000) RPR: Non Reactive (06/07 0805)  HBsAg: Negative (04/09 0000)  HIV: Non-reactive (04/09 0000)  GBS:  Negative (08/03 0000)   Assessment/Plan:  IUP at 40 weeks  Active Labor  Admit to BS Overall maternal fetal wellbeing reassuring Continuous monitoring Fentanyl PRN pain control  Expect NSVD  Anna Genre 03/14/2017, 6:29 PM  I confirm that I have verified the information documented in the student PA's note and that I have also personally performed the physical exam and all medical decision making activities.  Sharen Counter, CNM 8:48 PM

## 2017-03-14 NOTE — Anesthesia Pain Management Evaluation Note (Signed)
  CRNA Pain Management Visit Note  Patient: Sara Price, 26 y.o., female  "Hello I am a member of the anesthesia team at [facility]. We have an anesthesia team available at all times to provide care throughout the hospital, including epidural management and anesthesia for C-section. I don't know your plan for the delivery whether it a natural birth, water birth, IV sedation, nitrous supplementation, doula or epidural, but we want to meet your pain goals."   1.Was your pain managed to your expectations on prior hospitalizations?   No prior hospitalizations  2.What is your expectation for pain management during this hospitalization?     IV pain meds  3.How can we help you reach that goal? Epidural if desired. Spoke to patient through hospital interpretor. Patient knows to let nurse know if she changes her mind and wants a epidural.  Record the patient's initial score and the patient's pain goal.   Pain: 4  Pain Goal: 6 [facility] wants you to be able to say your pain was always managed very well.  Cadyn Rodger 03/14/2017

## 2017-03-14 NOTE — Progress Notes (Signed)
Sara Price is a 26 y.o. G1P0 at [redacted]w[redacted]d admitted for PROM, early labor  Subjective: Pt breathing with contractions.  Continuing with fentanyl for pain relief, no epidural desired.  Objective: BP 133/78   Pulse (!) 53   Temp 97.7 F (36.5 C) (Oral)   Resp 18   Ht 5\' 2"  (1.575 m)   Wt 79.4 kg (175 lb)   LMP 06/07/2016   BMI 32.01 kg/m  No intake/output data recorded. No intake/output data recorded.  FHT:  FHR: 110 bpm, variability: moderate,  accelerations:  Present,  decelerations:  Absent UC:   regular, every 2-3 minutes SVE:   Dilation: 7 Effacement (%): 90 Station: 0 Exam by:: A. Winfrey,MD  Labs: Lab Results  Component Value Date   WBC 11.8 (H) 03/14/2017   HGB 11.8 (L) 03/14/2017   HCT 34.5 (L) 03/14/2017   MCV 86.3 03/14/2017   PLT 218 03/14/2017    Assessment / Plan: Spontaneous labor, progressing normally  Labor: Progressing normally Preeclampsia:  n/a Fetal Wellbeing:  Category I Pain Control:  IV pain meds I/D:  GBS neg Anticipated MOD:  NSVD  [name] 03/14/2017, 9:57 PM

## 2017-03-14 NOTE — Anesthesia Preprocedure Evaluation (Signed)

## 2017-03-14 NOTE — Progress Notes (Signed)
Sara Price is a 26 y.o. G1P0 at [redacted]w[redacted]d admitted for PROM, early labor  Subjective: Pt breathing with contractions.  Pt reports that Fentanyl does relieve pain but does not desire epidural.  Objective: BP 126/81   Pulse (!) 57   Temp 97.7 F (36.5 C) (Oral)   Resp 18   Ht 5\' 2"  (1.575 m)   Wt 175 lb (79.4 kg)   LMP 06/07/2016   BMI 32.01 kg/m  No intake/output data recorded. No intake/output data recorded.  FHT:  FHR: 120 bpm, variability: moderate,  accelerations:  Present,  decelerations:  Absent UC:   regular, every 3 minutes SVE:   Dilation: 6 (interpreter at bedside) Effacement (%): 90 Station: 0 Exam by:: Dr. Freada Bergeron  Labs: Lab Results  Component Value Date   WBC 11.8 (H) 03/14/2017   HGB 11.8 (L) 03/14/2017   HCT 34.5 (L) 03/14/2017   MCV 86.3 03/14/2017   PLT 218 03/14/2017    Assessment / Plan: Spontaneous labor, progressing normally  Labor: Progressing normally Preeclampsia:  n/a Fetal Wellbeing:  Category I Pain Control:  IV pain meds I/D:  GBS neg Anticipated MOD:  NSVD  Sharen Counter 03/14/2017, 7:36 PM

## 2017-03-14 NOTE — Discharge Instructions (Signed)
Braxton Hicks Contractions °Contractions of the uterus can occur throughout pregnancy, but they are not always a sign that you are in labor. You may have practice contractions called Braxton Hicks contractions. These false labor contractions are sometimes confused with true labor. °What are Braxton Hicks contractions? °Braxton Hicks contractions are tightening movements that occur in the muscles of the uterus before labor. Unlike true labor contractions, these contractions do not result in opening (dilation) and thinning of the cervix. Toward the end of pregnancy (32-34 weeks), Braxton Hicks contractions can happen more often and may become stronger. These contractions are sometimes difficult to tell apart from true labor because they can be very uncomfortable. You should not feel embarrassed if you go to the hospital with false labor. °Sometimes, the only way to tell if you are in true labor is for your health care provider to look for changes in the cervix. The health care provider will do a physical exam and may monitor your contractions. If you are not in true labor, the exam should show that your cervix is not dilating and your water has not broken. °If there are no prenatal problems or other health problems associated with your pregnancy, it is completely safe for you to be sent home with false labor. You may continue to have Braxton Hicks contractions until you go into true labor. °How can I tell the difference between true labor and false labor? °· Differences °? False labor °? Contractions last 30-70 seconds.: Contractions are usually shorter and not as strong as true labor contractions. °? Contractions become very regular.: Contractions are usually irregular. °? Discomfort is usually felt in the top of the uterus, and it spreads to the lower abdomen and low back.: Contractions are often felt in the front of the lower abdomen and in the groin. °? Contractions do not go away with walking.: Contractions may  go away when you walk around or change positions while lying down. °? Contractions usually become more intense and increase in frequency.: Contractions get weaker and are shorter-lasting as time goes on. °? The cervix dilates and gets thinner.: The cervix usually does not dilate or become thin. °Follow these instructions at home: °· Take over-the-counter and prescription medicines only as told by your health care provider. °· Keep up with your usual exercises and follow other instructions from your health care provider. °· Eat and drink lightly if you think you are going into labor. °· If Braxton Hicks contractions are making you uncomfortable: °? Change your position from lying down or resting to walking, or change from walking to resting. °? Sit and rest in a tub of warm water. °? Drink enough fluid to keep your urine clear or pale yellow. Dehydration may cause these contractions. °? Do slow and deep breathing several times an hour. °· Keep all follow-up prenatal visits as told by your health care provider. This is important. °Contact a health care provider if: °· You have a fever. °· You have continuous pain in your abdomen. °Get help right away if: °· Your contractions become stronger, more regular, and closer together. °· You have fluid leaking or gushing from your vagina. °· You pass blood-tinged mucus (bloody show). °· You have bleeding from your vagina. °· You have low back pain that you never had before. °· You feel your baby’s head pushing down and causing pelvic pressure. °· Your baby is not moving inside you as much as it used to. °Summary °· Contractions that occur before labor are   called Braxton Hicks contractions, false labor, or practice contractions.  Braxton Hicks contractions are usually shorter, weaker, farther apart, and less regular than true labor contractions. True labor contractions usually become progressively stronger and regular and they become more frequent.  Manage discomfort from  [facility] contractions by changing position, resting in a warm bath, drinking plenty of water, or practicing deep breathing. This information is not intended to replace advice given to you by your health care provider. Make sure you discuss any questions you have with your health care provider. Document Released: 07/05/2005 Document Revised: 05/24/2016 Document Reviewed: 05/24/2016 Elsevier Interactive Patient Education  2017 [employer]. Contracciones de Designer, multimedia (Braxton Hicks Contractions) Durante el [place], pueden presentarse contracciones uterinas que no siempre indican que est en [place]. QU SON LAS CONTRACCIONES DE BRAXTON HICKS? [place] se presentan antes del [place] de [place] se conocen como contracciones de [place] o falso trabajo de [place]. Hacia el final del embarazo (32 a 34semanas), estas contracciones pueden aparecen con ms frecuencia y volverse ms intensas. No corresponden al Aleen Campi de parto verdadero porque estas contracciones no producen el agrandamiento (la dilatacin) y el afinamiento del cuello del tero. Algunas veces, es difcil distinguirlas del trabajo de parto verdadero porque en algunos casos pueden ser [employer], y las personas tienen diferentes niveles de tolerancia al [employer] No debe sentirse avergonzada si concurre al hospital con falso trabajo de [place]. En ocasiones, la nica forma de saber si el trabajo de parto es verdadero es que el mdico determine si hay cambios en el cuello del tero. Si no hay problemas prenatales u otras complicaciones de salud asociadas con el embarazo, no habr inconvenientes si la envan a su casa con falso trabajo de parto y espera que comience el verdadero. CMO DIFERENCIAR EL TRABAJO DE PARTO FALSO DEL VERDADERO Falso trabajo de parto  Las contracciones del falso trabajo de parto duran menos y no son tan intensas como las verdaderas.  Generalmente son irregulares.  A menudo, se sienten en  la parte delantera de la parte baja del abdomen y en la ingle,  y pueden desaparecer cuando camina o cambia de posicin mientras est acostada.  Las contracciones se vuelven ms dbiles y su duracin es Adult nurse a medida que el tiempo transcurre.  Por lo general, no se hacen progresivamente ms intensas, regulares y Herbalist entre s como en el caso del [place] de parto verdadero. Theodis Blaze de parto  Las contracciones del verdadero trabajo de parto duran de 30 a 70segundos, son muy regulares y suelen volverse ms intensas, y [place] su frecuencia.  No desaparecen cuando camina.  La molestia generalmente se siente en la parte superior del tero y se extiende hacia la zona inferior del abdomen y [employer] cintura.  El mdico podr examinarla para determinar si el trabajo de parto es verdadero. El examen mostrar si el cuello del tero se est dilatando y [place]. LO QUE DEBE RECORDAR  Contine haciendo los ejercicios habituales y siga otras indicaciones que el mdico le d.  Tome todos los medicamentos como le indic el mdico.  Oceanographer a las visitas prenatales regulares.  Coma y beba con moderacin si cree que est en trabajo de parto.  Si las contracciones de [employer] provocan incomodidad: ? Cambie de posicin: si est acostada o descansando, camine; si est caminando, descanse. ? Sintese y descanse en una baera con agua tibia. ? Beba 2 o 3vasos de agua. La deshidratacin puede provocar contracciones. ?  Respire lenta y profundamente varias veces por hora.  CUNDO DEBO BUSCAR ASISTENCIA MDICA INMEDIATA? Solicite atencin mdica de inmediato si:  Las contracciones se intensifican, se hacen ms regulares y Arboriculturist s.  Tiene una prdida de lquido por la vagina.  Tiene fiebre.  Elimina mucosidad manchada con [place].  Tiene una hemorragia vaginal abundante.  Tiene dolor abdominal permanente.  Tiene un dolor en la zona lumbar que nunca tuvo  antes.  Siente que la cabeza del beb empuja hacia abajo y ejerce presin en la zona plvica.  El beb no se mueve Dentist. Esta informacin no tiene Theme park manager el consejo del mdico. Asegrese de hacerle al mdico cualquier pregunta que tenga. Document Released: 04/14/2005 Document Revised: 10/27/2015 Document Reviewed: 04/16/2013 Elsevier Interactive Patient Education  2017 [employer].

## 2017-03-14 NOTE — Anesthesia Procedure Notes (Signed)
Epidural Patient location during procedure: OB  Staffing Anesthesiologist: Polo Mcmartin  Preanesthetic Checklist Completed: patient identified, pre-op evaluation, timeout performed, IV checked, risks and benefits discussed and monitors and equipment checked  Epidural Patient position: sitting Prep: DuraPrep Patient monitoring: blood pressure and continuous pulse ox Approach: right paramedian Location: L3-L4 Injection technique: LOR air  Needle:  Needle type: Tuohy  Needle gauge: 17 G Needle insertion depth: 5 cm Catheter type: closed end flexible Catheter size: 19 Gauge Catheter at skin depth: 10 cm Test dose: negative  Assessment Sensory level: T8  Additional Notes    Dosing of Epidural:  1st dose, through catheter .............................................  Xylocaine 40 mg  2nd dose, through catheter, after waiting 3 minutes.........Xylocaine 60 mg    As each dose occurred, patient was free of IV sx; and patient exhibited no evidence of SA injection.  Patient is more comfortable after epidural dosed. Please see RN's note for documentation of vital signs,and FHR which are stable.  Patient reminded not to try to ambulate with numb legs, and that an RN must be present when she attempts to get up.         

## 2017-03-14 NOTE — Progress Notes (Signed)
Pablo, FOB, at bedside, interpreting while patient was placed on monitors and pt asked for pain medicine.

## 2017-03-14 NOTE — MAU Note (Signed)
Pt presents to MAU c/o ctxs that started around 0300. Pt denies LOF. Pt reports some bleeding. Pt reports good fetal movement.

## 2017-03-15 ENCOUNTER — Encounter (HOSPITAL_COMMUNITY): Payer: Self-pay

## 2017-03-15 LAB — CBC
HCT: 31.6 % — ABNORMAL LOW (ref 36.0–46.0)
Hemoglobin: 10.9 g/dL — ABNORMAL LOW (ref 12.0–15.0)
MCH: 29.5 pg (ref 26.0–34.0)
MCHC: 34.5 g/dL (ref 30.0–36.0)
MCV: 85.4 fL (ref 78.0–100.0)
Platelets: 203 10*3/uL (ref 150–400)
RBC: 3.7 MIL/uL — AB (ref 3.87–5.11)
RDW: 14.8 % (ref 11.5–15.5)
WBC: 15.4 10*3/uL — ABNORMAL HIGH (ref 4.0–10.5)

## 2017-03-15 LAB — RPR: RPR: NONREACTIVE

## 2017-03-15 MED ORDER — ACETAMINOPHEN 325 MG PO TABS: 650.0000 mg | ORAL_TABLET | ORAL | Status: DC | PRN

## 2017-03-15 MED ORDER — ONDANSETRON HCL 4 MG/2ML IJ SOLN: 4.0000 mg | INTRAMUSCULAR | Status: DC | PRN

## 2017-03-15 MED ORDER — COCONUT OIL OIL
1.0000 "application " | TOPICAL_OIL | Status: DC | PRN
  Administered 2017-03-17: 1 via TOPICAL
  Filled 2017-03-15: qty 120

## 2017-03-15 MED ORDER — OXYTOCIN 10 UNIT/ML IJ SOLN
INTRAMUSCULAR | Status: AC
  Filled 2017-03-15: qty 1

## 2017-03-15 MED ORDER — SENNOSIDES-DOCUSATE SODIUM 8.6-50 MG PO TABS
2.0000 | ORAL_TABLET | ORAL | Status: DC
  Administered 2017-03-15 – 2017-03-16 (×2): 2 via ORAL
  Filled 2017-03-15 (×2): qty 2

## 2017-03-15 MED ORDER — SIMETHICONE 80 MG PO CHEW: 80.0000 mg | CHEWABLE_TABLET | ORAL | Status: DC | PRN

## 2017-03-15 MED ORDER — ONDANSETRON HCL 4 MG PO TABS: 4.0000 mg | ORAL_TABLET | ORAL | Status: DC | PRN

## 2017-03-15 MED ORDER — ZOLPIDEM TARTRATE 5 MG PO TABS: 5.0000 mg | ORAL_TABLET | Freq: Every evening | ORAL | Status: DC | PRN

## 2017-03-15 MED ORDER — WITCH HAZEL-GLYCERIN EX PADS: 1.0000 "application " | MEDICATED_PAD | CUTANEOUS | Status: DC | PRN

## 2017-03-15 MED ORDER — DIPHENHYDRAMINE HCL 25 MG PO CAPS: 25.0000 mg | ORAL_CAPSULE | Freq: Four times a day (QID) | ORAL | Status: DC | PRN

## 2017-03-15 MED ORDER — BENZOCAINE-MENTHOL 20-0.5 % EX AERO: 1.0000 "application " | INHALATION_SPRAY | CUTANEOUS | Status: DC | PRN

## 2017-03-15 MED ORDER — DIBUCAINE 1 % RE OINT: 1.0000 "application " | TOPICAL_OINTMENT | RECTAL | Status: DC | PRN

## 2017-03-15 MED ORDER — PRENATAL MULTIVITAMIN CH
1.0000 | ORAL_TABLET | Freq: Every day | ORAL | Status: DC
  Administered 2017-03-15 – 2017-03-16 (×2): 1 via ORAL
  Filled 2017-03-15 (×2): qty 1

## 2017-03-15 MED ORDER — TETANUS-DIPHTH-ACELL PERTUSSIS 5-2.5-18.5 LF-MCG/0.5 IM SUSP: 0.5000 mL | Freq: Once | INTRAMUSCULAR | Status: DC

## 2017-03-15 MED ORDER — IBUPROFEN 600 MG PO TABS
600.0000 mg | ORAL_TABLET | Freq: Four times a day (QID) | ORAL | Status: DC
  Administered 2017-03-15 – 2017-03-17 (×9): 600 mg via ORAL
  Filled 2017-03-15 (×9): qty 1

## 2017-03-15 NOTE — Lactation Note (Signed)
This note was copied from a baby's chart. Lactation Consultation Note  Patient Name: Sara Price JIRCV'E Date: 03/15/2017 Reason for consult: Initial assessment - ( Dad  Signed the consent to be the Spanish interpreter for mom ) LC checked chart to confirm )  Baby is 21 hours old and prior to [facility] visit has been to the breast for 40 mins with Latch score of 8 , and swallows.  When LC entered the room baby wrapped up in blankets, T-shirt and hat . LC recommended  And offered to assist to check the diaper and place STS since it has been awhile since the last feeding. LC  Checked diaper and changed a large wet ( showed the parents wet / blue strip ( indicator in the diaper ), only wet.  Baby awake, and attempted to latch, baby opens wide and mouthed the nipple / areola on and off and released.  Per mom had been shown how to hand express, LC reviewed and mom repeated and was able to demo back .  EBM drops applied to spoon and fed back to baby.  Due to semi compressible areolas bilaterally , the right more compressible than left.  Prior to assisting with latch - had mom massage breast, hand express, pre-pump to make the nipple and areola complex more  Elastic to latch the baby with depth. Baby wasn't overly interested.   LC Plan : ( reviewed with mom and dad )  Shells between feedings when not STS  Prior to latch - 1st breast - breast massage, hand express, pre-pump  With hand pump to make the nipple and areola more elastic.  Latch with firm support, with breast compressions until swallows and then intermittently Breast feeding goal is at least 8 feedings in 24 hours ( mom and dad aware it takes time )  And if the baby is sleepy , STS every few hours.   Mother informed of post-discharge support and given phone number to the lactation department, including services for phone call assistance; out-patient appointments; and breastfeeding support group. List of other breastfeeding resources in  the community given in the handout. Encouraged mother to call for problems or concerns related to breastfeeding.   Maternal Data Has patient been taught Hand Expression?: Yes (several drops , and mom returned demo ) Does the patient have breastfeeding experience prior to this delivery?: No  Feeding Feeding Type: Breast Fed  LATCH Score Latch: Repeated attempts needed to sustain latch, nipple held in mouth throughout feeding, stimulation needed to elicit sucking reflex.  Audible Swallowing: None  Type of Nipple: Everted at rest and after stimulation (semi compressible areolas , better after pre-pump and reverse pressure )  Comfort (Breast/Nipple): Soft / non-tender  Hold (Positioning): Assistance needed to correctly position infant at breast and maintain latch.  LATCH Score: 6  Interventions Interventions: Breast feeding basics reviewed;Assisted with latch;Skin to skin;Breast massage;Hand express;Pre-pump if needed;Reverse pressure;Adjust position;Support pillows;Position options;Expressed milk;Shells;Hand pump  Lactation Tools Discussed/Used Tools: Shells;Pump Shell Type: Inverted Breast pump type: Manual (#24 Flange a good size ) WIC Program:  (mom would like to sign up ) Pump Review: Setup, frequency, and cleaning;Milk Storage Initiated by:: MAI  Date initiated:: 03/15/17   Consult Status Consult Status: Follow-up Date: 03/16/17 Follow-up type: In-patient    Matilde Sprang Saber Dickerman 03/15/2017, 2:10 PM

## 2017-03-15 NOTE — Anesthesia Postprocedure Evaluation (Signed)
Anesthesia Post Note  Patient: Sara Price  Procedure(s) Performed: * No procedures listed *     Patient location during evaluation: Mother Baby Anesthesia Type: Epidural Level of consciousness: awake and alert and oriented Pain management: pain level controlled Vital Signs Assessment: post-procedure vital signs reviewed and stable Respiratory status: spontaneous breathing and nonlabored ventilation Cardiovascular status: stable Postop Assessment: no headache, patient able to bend at knees, no backache, no signs of nausea or vomiting, epidural receding and adequate PO intake Anesthetic complications: no    Last Vitals:  Vitals:   03/15/17 0520 03/15/17 0631  BP: 113/74 112/68  Pulse: 81 67  Resp: 18 18  Temp: 36.4 C 37 C    Last Pain:  Vitals:   03/15/17 0634  TempSrc:   PainSc: 0-No pain   Pain Goal:                 [employer]

## 2017-03-16 NOTE — Progress Notes (Addendum)
Post Partum Day 1  Subjective:  Sara Price is a 26 y.o. G1P1001 4064w1d s/p SVD.  No acute events overnight.  Pt denies problems with ambulating, voiding or po intake.  She denies nausea or vomiting.  Pain is well controlled.  She has had flatus. She has not had bowel movement.  Lochia Moderate.  Plan for birth control is oral progesterone-only contraceptive.  Method of Feeding: breast and bottle  Objective: BP (!) 105/58   Pulse 63   Temp 97.6 F (36.4 C) (Oral)   Resp 18   Ht 5\' 2"  (1.575 m)   Wt 79.4 kg (175 lb)   LMP 06/07/2016   Breastfeeding? Unknown   BMI 32.01 kg/m   Physical Exam:  General: alert, cooperative and no distress Lochia:normal flow Chest: CTAB Heart: RRR no m/r/g Abdomen: +BS, soft, nontender, fundus firm at/below umbilicus Uterine Fundus: firm DVT Evaluation: No evidence of DVT seen on physical exam. Extremities: No edema   Recent Labs  03/14/17 1730 03/15/17 0609  HGB 11.8* 10.9*  HCT 34.5* 31.6*    Assessment/Plan:  ASSESSMENT: Sara Price is a 26 y.o. G1P1001 7364w1d ppd #1 s/p NSVD doing well.  Would like to go home today, but since her son requires phototherapy for bilirubin, she will stay until tomorrow.    Plan for discharge tomorrow   LOS: 2 days   Lennox Soldersmanda C Winfrey 03/16/2017, 9:08 AM   I spoke with and examined patient and agree with resident/PA/SNM's note and plan of care.  Cheral MarkerKimberly R. Booker, CNM, [facility]NP-BC 03/16/2017 9:21 AM

## 2017-03-16 NOTE — Lactation Note (Signed)
This note was copied from a baby's chart. Lactation Consultation Note  Patient Name: Sara Price UEAVW'UToday's Date: 03/16/2017 Reason for consult: Follow-up assessment;NICU baby;1st time breastfeeding;Hyperbilirubinemia   Follow up with mom and dad in the NICU. FOB served as interpreter. FOB reports mom is pumping every 2-3 hours with DEBP and following with hand expression. He reports mom is getting small gtts. Reviewed colostrum and normal progression of milk coming to volume. Enc mom to continue pumping every 2-3 hours with a 4-5 hour stretch at night with no pumping to rest. Informed dad I left colostrum collection containers in his room and enc parents to bring all EBM to the infant. Discussed labeling of EBM.   Left Spanish copy of Providing Milk for Your Baby in NICU in mom's PP room, was not able to review with parents at this time. [facility]. Enc mom to call out with any questions/concerns. Mom reports she has no questions/concerns at this time.    Maternal Data Formula Feeding for Exclusion: Yes Reason for exclusion: Mother's choice to formula and breast feed on admission Has patient been taught Hand Expression?: Yes (per dad)  Feeding Feeding Type: Formula  LATCH Score                   Interventions    Lactation Tools Discussed/Used WIC Program: Yes Pump Review: Setup, frequency, and cleaning Initiated by:: Reviewed and encouraged every 2-3 hours with 4-5 hour stretch at night to rest   Consult Status Consult Status: Follow-up Date: 03/17/17 Follow-up type: In-patient    Silas FloodSharon S Elianna Windom 03/16/2017, 4:41 PM

## 2017-03-16 NOTE — Progress Notes (Signed)
MOB was set up with the double electric pump.  Education was completed via FOB who has signed the translator form.  MOB voices understanding and has no questions.  MOB advised by pediatrician to not breastfeed until next serum bili results come back, need parents to keep baby under the lights.  Encouraged to continue pumping and bottle feed for now.

## 2017-03-16 NOTE — Lactation Note (Signed)
This note was copied from a baby's chart. Lactation Consultation Note  Patient Name: Sara Price WGNFA'OToday's Date: 03/16/2017   Attempted to visit with mom and dad. Infant was being transferred to NICU and parents were going with infant. Mom has a DEBP set up in the room. Lactation will follow up at a later time.      Maternal Data    Feeding Feeding Type: Formula  LATCH Score                   Interventions    Lactation Tools Discussed/Used     Consult Status      Ed BlalockSharon S Lizeth Bencosme 03/16/2017, 3:29 PM

## 2017-03-17 ENCOUNTER — Encounter: Payer: Self-pay | Admitting: Obstetrics & Gynecology

## 2017-03-17 DIAGNOSIS — Z3A40 40 weeks gestation of pregnancy: Secondary | ICD-10-CM

## 2017-03-17 MED ORDER — IBUPROFEN 600 MG PO TABS: 600.0000 mg | ORAL_TABLET | Freq: Four times a day (QID) | ORAL | 0 refills | Status: DC

## 2017-03-17 MED ORDER — ACETAMINOPHEN 325 MG PO TABS: 650.0000 mg | ORAL_TABLET | ORAL | 0 refills | Status: DC | PRN

## 2017-03-17 NOTE — Lactation Note (Addendum)
This note was copied from a baby's chart. Lactation Consultation Note  Patient Name: Sara Price ZOXWR'UToday's Date: 03/17/2017 Reason for consult: Follow-up assessment;NICU baby;Hyperbilirubinemia   Follow up with mom of 53 hour old NICU infant. Spoke with mom with father serving as interpreter. Infant in the NICU and parents report bilirubin has decreased.   Dad reports mom is pumping regularly and volume is increasing. Enc mom to continue pumping every 2-3 hours for 15 minutes with a 4-5 hour stretch at night with no pumping to rest. Mom is no longer hand expressing, enc mom to continue to hand express post BF to empty breasts. Discussed with parents that EBM is helpful in infant excreting bilirubin. Reviewed pumping schedule and breast milk storage for the NICU infant in the Providing Milk for Your Baby in NICU infant. Reviewed transporting milk to NICU in cooler.   Mom with clear blister to right nipple, she asked how to care for it. Discussed increasing flange size to 27 as 24 are thought to be too tight at this time. Discussed with mom and dad how to assess if flanges are the right fit and often once swelling decreased in breast it is common to need to decrease size of flange again. Enc mom to decrease suction level for today and begin to increase again tomorrow. Coconut oil given to mom to apply to breasts before pumping for lubrication and advised mom to pat coconut oil off the nipple after pumping. Enc mom to apply EBM and then comfort gels to the nipples post feeding. Mom has breast shells that she is wearing. Told her she can wear comfort gels for 20-30 minutes post pumping and then reapply breast shells if she desires. Instructed not to wear breast shells at night or when sleeping. Discussed use and cleaning of comfort gels.   [facility] reviewed, enc parents to call LC with any questions/concerns or LC services needed in NICU.  Engorgement prevention/treatment reviewed with mom and  dad. Spoke with WIC rep this morning and she is planning to bring mom a DEBP this morning before d/c. Parents report no further questions/concerns at this time.   Maternal Data Formula Feeding for Exclusion: Yes Reason for exclusion: Mother's choice to formula and breast feed on admission Has patient been taught Hand Expression?: Yes Does the patient have breastfeeding experience prior to this delivery?: No  Feeding Feeding Type: Formula Nipple Type: Slow - flow Length of feed: 15 min  LATCH Score       Type of Nipple: Everted at rest and after stimulation  Comfort (Breast/Nipple): Filling, red/small blisters or bruises, mild/mod discomfort        Interventions Interventions: Expressed milk;Hand express;DEBP;Comfort gels;Shells;Coconut oil  Lactation Tools Discussed/Used WIC Program: Yes Pump Review: Setup, frequency, and cleaning;Milk Storage (for NICU baby) Initiated by:: Reviewed and encouraged   Consult Status Consult Status: PRN Follow-up type: Call as needed    Ed BlalockSharon S Lekha Dancer 03/17/2017, 9:56 AM

## 2017-03-17 NOTE — Discharge Summary (Signed)
OB Discharge Summary     Patient Name: Michiel [name] DOB: [birth year] MRN: [id number]  Date of admission: 03/14/2017 Delivering MD: Pincus LargePHELPS, JAZMA Y   Date of discharge: 03/17/2017  Admitting diagnosis: 40WKW CTX Intrauterine pregnancy: 1572w1d     Secondary diagnosis:  Active Problems:   SVD (spontaneous vaginal delivery)  Additional problems: none     Discharge diagnosis: Term Pregnancy Delivered                                                                                                Post partum procedures:none  Augmentation: Pitocin  Complications: None  Hospital course:  Onset of Labor With Vaginal Delivery     26 y.o. yo G1P1001 at 4872w1d was admitted in Latent Labor on 03/14/2017. Patient had an uncomplicated labor course as follows:  Membrane Rupture Time/Date: 10:30 AM ,03/14/2017   Intrapartum Procedures: Episiotomy:                                           Lacerations:  2nd degree [3]  Patient had a delivery of a Viable infant. 03/15/2017  Information for the patient's newborn:  Letta MedianVillalta-Ramos, Boy Cheyene [[id number]][[id number]]  Delivery Method: Vaginal, Spontaneous Delivery (Filed from Delivery Summary)    Pateint had an uncomplicated postpartum course.  She is ambulating, tolerating a regular diet, passing flatus, and urinating well. Patient is discharged home in stable condition on 03/17/17.   Physical exam  Vitals:   03/15/17 1800 03/16/17 0614 03/16/17 1819 03/17/17 0525  BP: 109/63 (!) 105/58 113/65 (!) 109/51  Pulse: 74 63 61 70  Resp: 18 18 18 16   Temp: 98.1 F (36.7 C) 97.6 F (36.4 C) 98.1 F (36.7 C) 98.2 F (36.8 C)  TempSrc: Oral Oral Oral Oral  Weight:      Height:       General: alert, cooperative and no distress Lochia: appropriate Uterine Fundus: firm Incision: N/A DVT Evaluation: No evidence of DVT seen on physical exam. Negative Homan's sign. No cords or calf tenderness. No significant calf/ankle edema. Labs: Lab Results   Component Value Date   WBC 15.4 (H) 03/15/2017   HGB 10.9 (L) 03/15/2017   HCT 31.6 (L) 03/15/2017   MCV 85.4 03/15/2017   PLT 203 03/15/2017   No flowsheet data found.  Discharge instruction: per After Visit Summary and "Baby and Me Booklet".  After visit meds:  Allergies as of 03/17/2017   No Known Allergies     Medication List    STOP taking these medications   progesterone 100 MG capsule Commonly known as:  PROMETRIUM     TAKE these medications   acetaminophen 325 MG tablet Commonly known as:  TYLENOL Take 2 tablets (650 mg total) by mouth every 4 (four) hours as needed (for pain scale < 4).   ibuprofen 600 MG tablet Commonly known as:  ADVIL,MOTRIN Take 1 tablet (600 mg total) by mouth every 6 (six) hours.   Prenatal Vitamins 28-0.8 MG Tabs Take 1 tablet  by mouth daily.            Discharge Care Instructions        Start     Ordered   03/17/17 0000  acetaminophen (TYLENOL) 325 MG tablet  Every 4 hours PRN     03/17/17 0734   03/17/17 0000  ibuprofen (ADVIL,MOTRIN) 600 MG tablet  Every 6 hours     03/17/17 0734      Diet: routine diet  Activity: Advance as tolerated. Pelvic rest for 6 weeks.   Outpatient follow up:4 weeks Follow up Appt:Future Appointments Date Time Provider Department Center  04/27/2017 1:20 PM Marny Lowenstein, PA-C [facility]   Follow up Visit:No Follow-up on file.  Postpartum contraception: Progesterone only pills  Newborn Data: Live born female  Birth Weight: 7 lb 7.9 oz (3400 g) APGAR: 8, 9  Baby Feeding: Bottle and Breast Disposition:home with mother   03/17/2017 [name], MD   CNM attestation I have seen and examined this patient and agree with above documentation in the resident's note.   Carie Villalta-Ramos is a 26 y.o. G1P1001 s/p SVD.   Pain is well controlled.  Plan for birth control is oral progesterone-only contraceptive.  Method of Feeding: breast  PE:  BP (!) 109/51 (BP Location: Left Arm)    Pulse 70   Temp 98.2 F (36.8 C) (Oral)   Resp 16   Ht 5\' 2"  (1.575 m)   Wt 79.4 kg (175 lb)   LMP 06/07/2016   Breastfeeding? Unknown   BMI 32.01 kg/m  Fundus firm   Recent Labs  03/14/17 1730 03/15/17 0609  HGB 11.8* 10.9*  HCT 34.5* 31.6*     Plan: discharge today - postpartum care discussed - f/u clinic in 4 weeks for postpartum visit   Cam Hai, CNM 9:52 AM 03/17/2017

## 2017-03-20 ENCOUNTER — Ambulatory Visit: Payer: Self-pay

## 2017-03-20 NOTE — Lactation Note (Signed)
This note was copied from a baby's chart. Lactation Consultation Note  Patient Name: Sara Michiel [name] WUJWJ'XToday's Date: 03/20/2017 Reason for consult: Follow-up assessment;NICU baby, now 325 days old, full ter, in the NICU rooming gin tonight. Mom was using 27 flanges, which at this time were too large for her. I decreased her to 24's, and she said these were more comfortable, and her nipples did not swell as much. Mom has a very good milk supply, and has been pumping until she stops dripping.  I fitted mom with a 24 nipple shield, showed her how to apply, and advised her to try using this to latch the baby. I also told them to try and see lactation tomorrow, prior to discharge.    Maternal Data    Feeding Feeding Type: Breast Milk Nipple Type: Dr. Lorne SkeensBrowns Preemie  [facility]ATCH Score                   Interventions    Lactation Tools Discussed/Used Tools: Flanges;Coconut oil Flange Size: 24   Consult Status Consult Status: PRN Follow-up type: In-patient (NICU)    Alfred LevinsLee, Charvi Gammage Anne 03/20/2017, 4:37 PM

## 2017-04-27 ENCOUNTER — Ambulatory Visit: Payer: Self-pay | Admitting: Advanced Practice Midwife

## 2017-05-16 ENCOUNTER — Ambulatory Visit (INDEPENDENT_AMBULATORY_CARE_PROVIDER_SITE_OTHER): Payer: Self-pay | Admitting: Advanced Practice Midwife

## 2017-05-16 DIAGNOSIS — K649 Unspecified hemorrhoids: Secondary | ICD-10-CM

## 2017-05-16 DIAGNOSIS — K5900 Constipation, unspecified: Secondary | ICD-10-CM

## 2017-05-16 MED ORDER — NORETHINDRONE 0.35 MG PO TABS: 1.0000 | ORAL_TABLET | Freq: Every day | ORAL | 11 refills | Status: DC

## 2017-05-16 NOTE — Progress Notes (Signed)
Subjective:     Sara Price is a 26 y.o. female who presents for a postpartum visit. She is 9 weeks postpartum following a spontaneous vaginal delivery. I have fully reviewed the prenatal and intrapartum course. The delivery was at 40 gestational weeks. Outcome: spontaneous vaginal delivery. Anesthesia: epidural. Postpartum course has been uncomploicated. Baby's course has been uncomplicatied. Baby is feeding by breast and bottle (Similac Advance). Bleeding no bleeding. Bowel function is abnormal: constipation, possible hemorrhoids. Bladder function is normal. Patient is sexually active. Contraception method is none. Postpartum depression screening: negative.  Constipation and hemorrhoids. Has not taken anything for these sx at home.  Spanish video interpreter "Renea Eevelyn" [phone]  The following portions of the patient's history were reviewed and updated as appropriate: allergies, current medications, past family history, past medical history, past social history, past surgical history and problem list.  Review of Systems Pertinent items are noted in HPI.   Objective:    There were no vitals taken for this visit.  General:  alert, cooperative and appears stated age   Breasts:  inspection negative, no nipple discharge or bleeding, no masses or nodularity palpable  Lungs: clear to auscultation bilaterally  Heart:  regular rate and rhythm, S1, S2 normal, no murmur, click, rub or gallop  Abdomen: soft, non-tender; bowel sounds normal; no masses,  no organomegaly   Vulva:  normal well healed   Vagina: normal vagina  Cervix:     Corpus: not examined  Adnexa:  not evaluated  Rectal Exam:   some hemorrhoid skin tag seen, but no internal or external hemorrhoids noted.         Assessment:     Normal 9 weeks postpartum exam. Pap smear not done at today's visit.   1. Postpartum care and examination   2. Constipation, unspecified constipation type   3. Hemorrhoids, unspecified hemorrhoid type      Plan:    1. Contraception: oral progesterone-only contraceptive 2. List of medications given for hemorrhoids and constipation  3. Follow up in: PRN   or as needed.   Free pap clinic info given

## 2017-05-16 NOTE — Patient Instructions (Addendum)
Estreimiento:  Social research officer, governmentColace  Ducolax (supositorios)  Fleet enema (lavado intestinal rectal)  Glycerin (supositorios)  Metamucil  Milk of magnesia (leche de magnesia)  Miralax  Senokot  Smooth Move (t)   Hemorroides:  Anusol  Anusol HC  Preparation H  Tucks  Indigestin:  Tums  Maalox  Mylanta  Zantac  Pepcid   FREE PAP CLINIC: Call [phone] Tell them you would like to sign up for free pap clinic   [facility] DE PAP [facility]AT[facility]: [place] [phone] Diles que te gustaria inscribirte en una clinica de papanicolau gratis

## 2017-05-17 LAB — POCT PREGNANCY, URINE: PREG TEST UR: NEGATIVE

## 2017-06-29 IMAGING — US US MFM OB TRANSVAGINAL
1 series · 13 of 28 positions shown · non-contrast
Comparison: none

[Series 1: us mfm ob transvaginal · 13 of 94 slices shown]
[im 4/94]
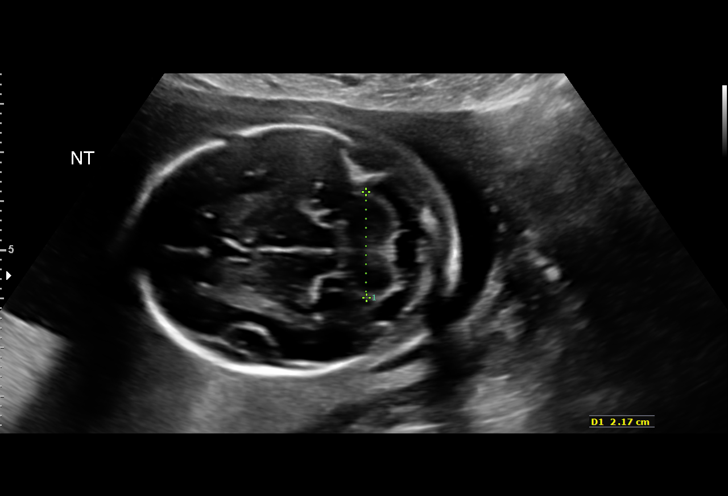
[im 11/94]
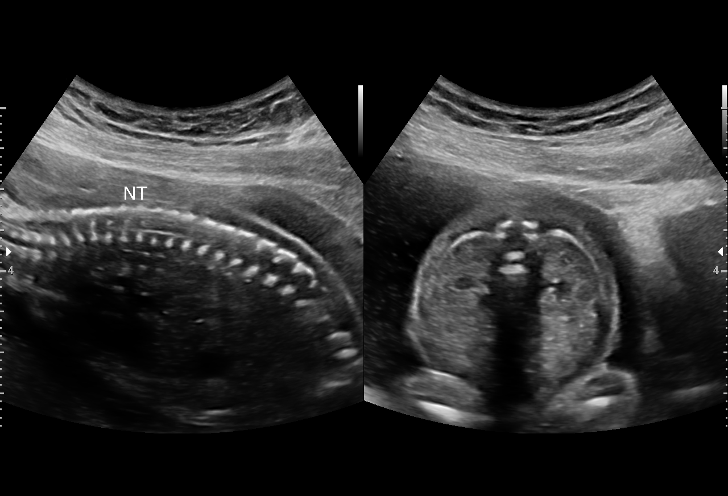
[im 18/94]
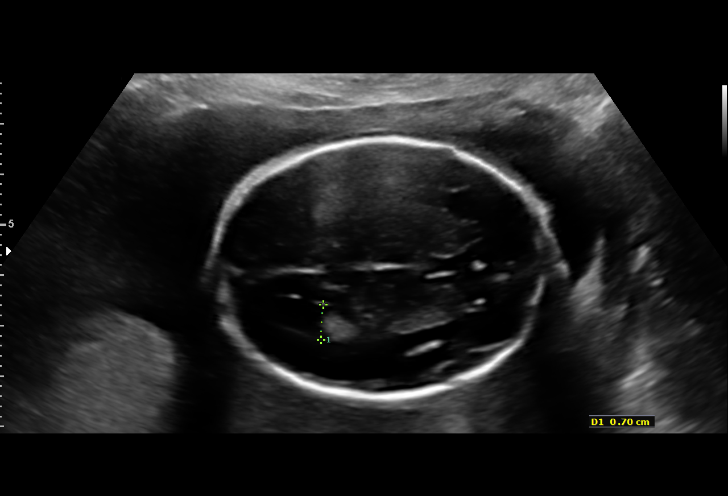
[im 25/94]
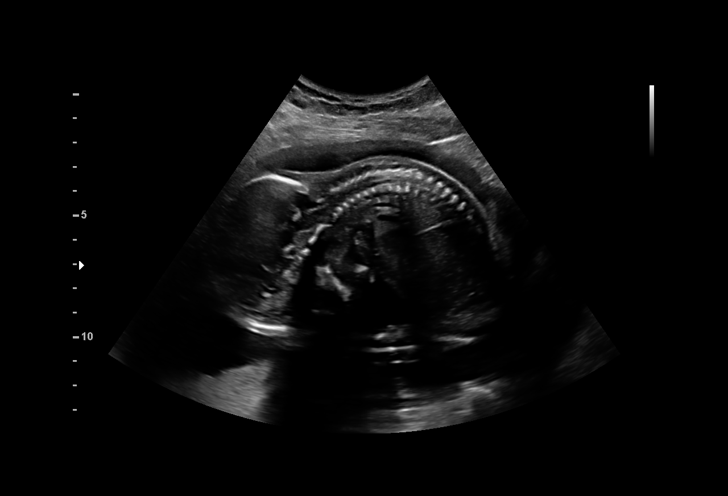
[im 32/94]
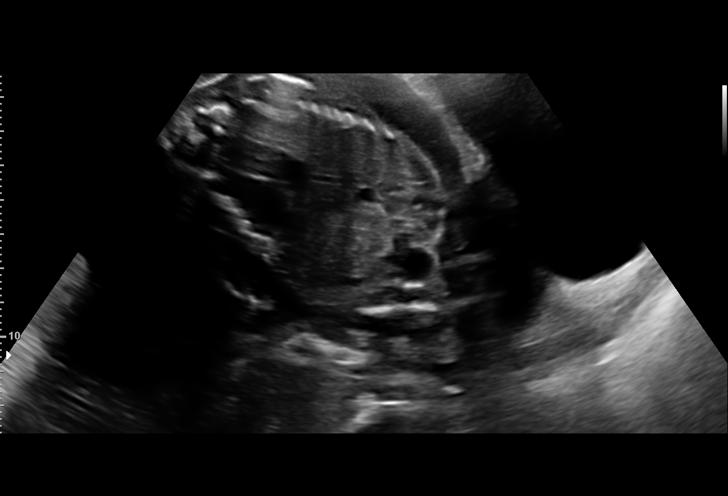
[im 38/94]
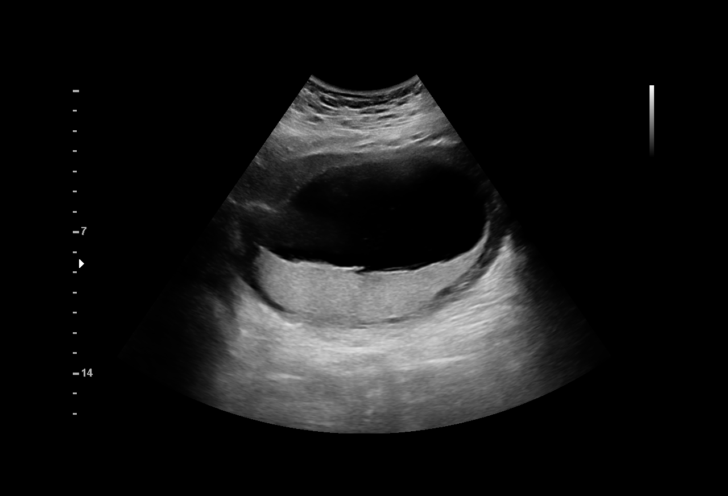
[im 49/94]
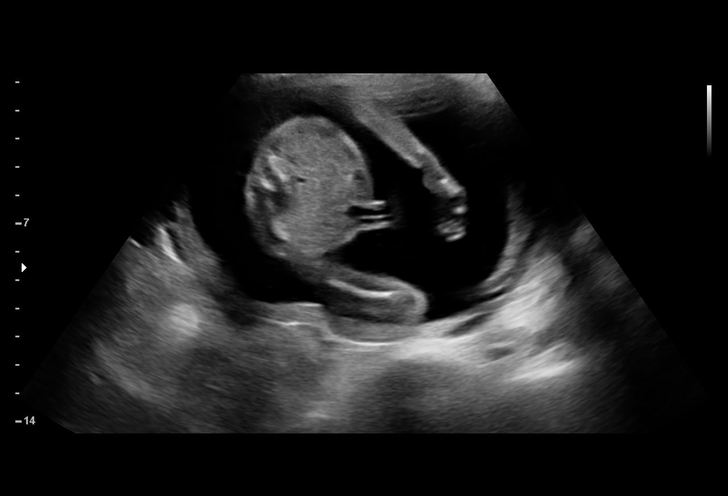
[im 56/94]
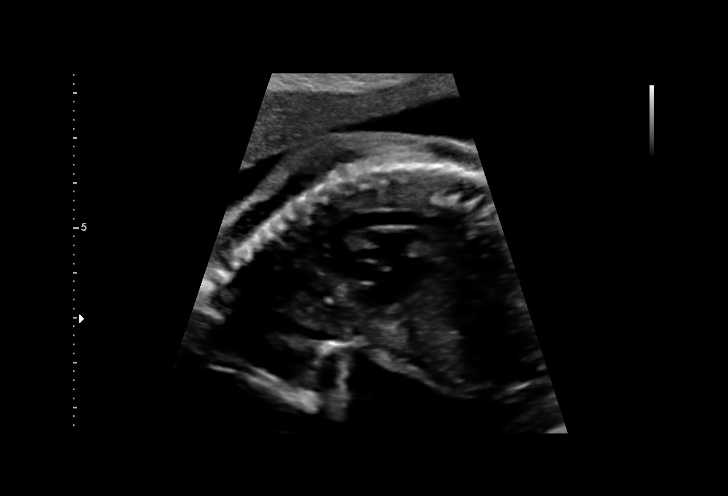
[im 63/94]
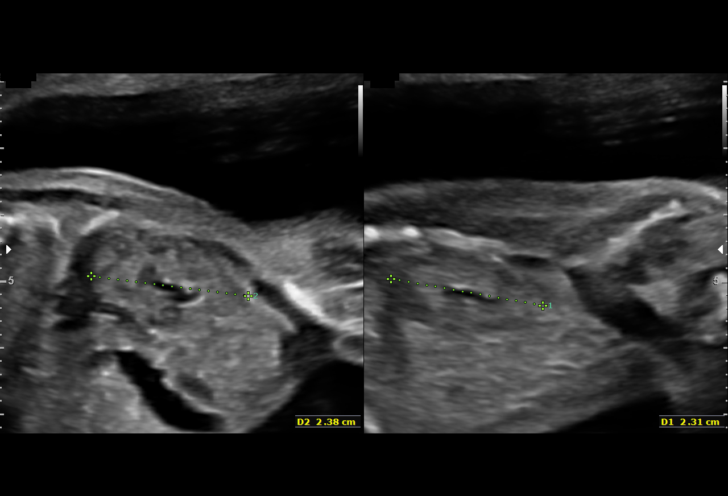
[im 69/94]
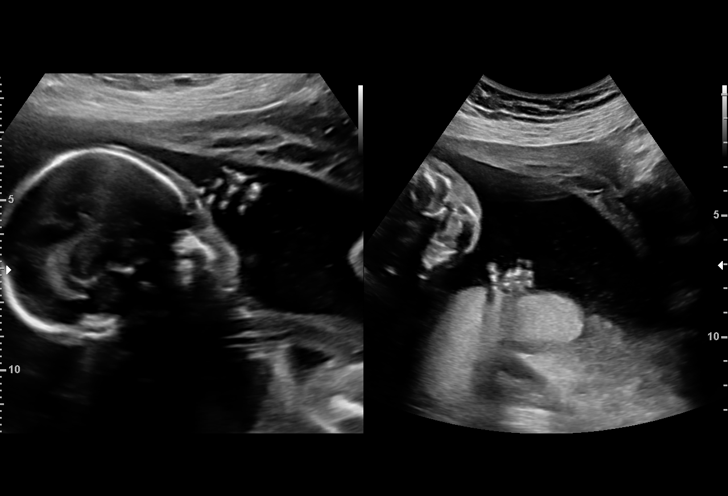
[im 76/94]
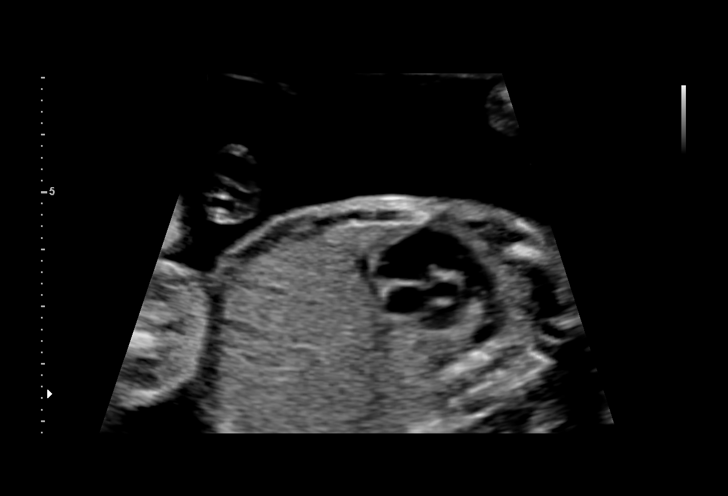
[im 83/94]
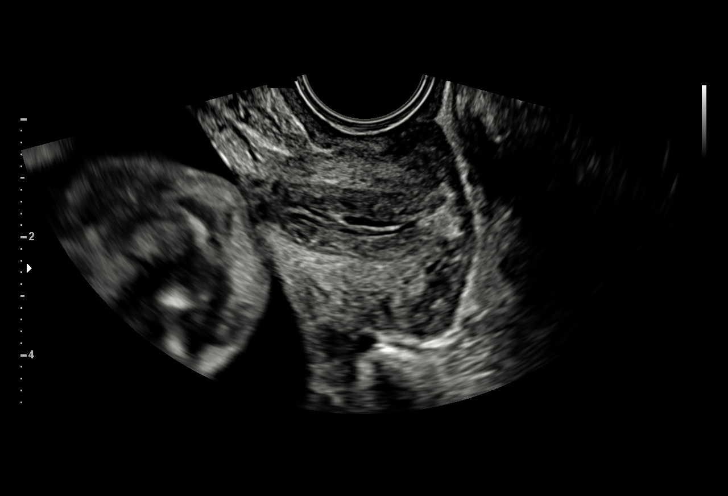
[im 90/94]
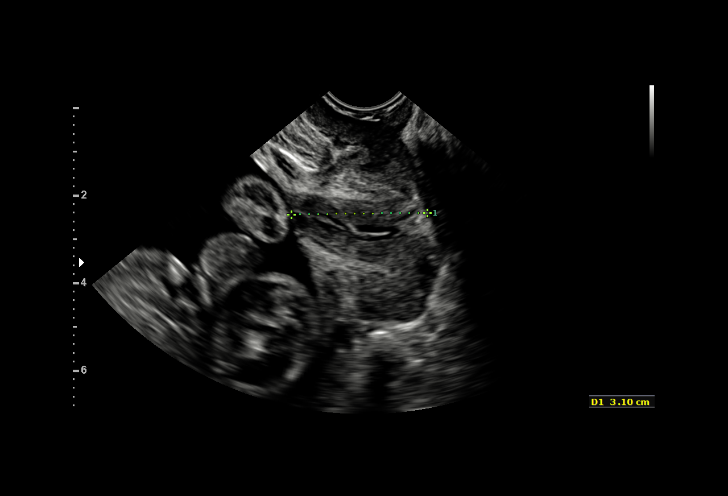

[13 of 28 positions shown; findings below may reference images not displayed]

1  PER-LENNART MUNTHER            [id number]      [id number]     [id number]
2  PER-LENNART MUNTHER            [id number]      [id number]     [id number]
Indications

21 weeks gestation of pregnancy
Encounter for fetal anatomic survey
Cervical shortening, second trimester
Placenta previa specified as without
hemorrhage, second trimester
OB History

Gravidity:    1         Term:   0        Prem:   0        SAB:   0
TOP:          0       Ectopic:  0        Living: 0
Fetal Evaluation

Num Of Fetuses:     1
Fetal Heart         156
Rate(bpm):
Cardiac Activity:   Observed
Presentation:       Breech
Placenta:           Posterior, above cervical os
P. Cord Insertion:  Visualized, central

Amniotic Fluid
AFI FV:      Subjectively within normal limits

Largest Pocket(cm)
5.85
Biometry
BPD:      50.6  mm     G. Age:  21w 2d         38  %    CI:        69.83   %   70 - 86
FL/HC:      19.3   %   18.4 -
HC:      193.2  mm     G. Age:  21w 4d         39  %    HC/AC:      1.13       1.06 -
AC:      170.4  mm     G. Age:  22w 0d         57  %    FL/BPD:     73.5   %   71 - 87
FL:       37.2  mm     G. Age:  21w 6d         50  %    FL/AC:      21.8   %   20 - 24
HUM:        35  mm     G. Age:  22w 0d         60  %
CER:      21.2  mm     G. Age:  20w 1d         16  %

CM:        5.5  mm
Est. FW:     458  gm          1 lb      51  %
Gestational Age

LMP:           21w 4d       Date:   06/07/16                 EDD:   03/14/17
U/S Today:     21w 5d                                        EDD:   03/13/17
Best:          21w 4d    Det. By:   LMP  (06/07/16)          EDD:   03/14/17
Anatomy

Cranium:               Appears normal         Aortic Arch:            Appears normal
Cavum:                 Appears normal         Ductal Arch:            Appears normal
Ventricles:            Appears normal         Diaphragm:              Appears normal
Choroid Plexus:        Appears normal         Stomach:                Appears normal, left
sided
Cerebellum:            Appears normal         Abdomen:                Appears normal
Posterior Fossa:       Appears normal         Abdominal Wall:         Appears nml (cord
insert, abd wall)
Nuchal Fold:           Not applicable (>20    Cord Vessels:           Appears normal (3
wks GA)                                        vessel cord)
Face:                  Appears normal         Kidneys:                Appear normal
(orbits and profile)
Lips:                  Appears normal         Bladder:                Appears normal
Thoracic:              Appears normal         Spine:                  Appears normal
Heart:                 Appears normal         Upper Extremities:      Appears normal
(4CH, axis, and
situs)
RVOT:                  Appears normal         Lower Extremities:      Appears normal
LVOT:                  Appears normal

Other:  Fetus appears to be a male. Heels and 5th digit visualized. Open
hands visualized. Nasal bone visualized.
Cervix Uterus Adnexa

Cervix
Length:            3.3  cm.
Normal appearance by transvaginal scan

Uterus
No abnormality visualized.

Left Ovary
Within normal limits.

Right Ovary
Within normal limits.

Cul De Sac:   No free fluid seen.

Adnexa:       No abnormality visualized.
Impression

Singleton intrauterine pregnancy at 21+4 weeks, here for
evaluation of cervix. Cervix noted to be short on previous US
Review of the anatomy shows no sonographic markers for
aneuploidy or structural anomalies
Amniotic fluid volume is normal
Estimated fetal weight is 458g which is growth in the 51st
percentile
Cervix is 33mm on transvaginal exam, with no funnelling or
changes with transfundal pressure
Recommendations

Repeat TV cervical length in 2 weeks. I asked the patient to
remain on her vaginal progesterone untill we recheck her
cervix and confirm normal length

## 2019-02-25 ENCOUNTER — Encounter (HOSPITAL_COMMUNITY): Payer: Self-pay

## 2019-02-25 ENCOUNTER — Inpatient Hospital Stay (HOSPITAL_COMMUNITY): Payer: Self-pay

## 2019-02-25 ENCOUNTER — Inpatient Hospital Stay (HOSPITAL_COMMUNITY)
Admission: AD | Admit: 2019-02-25 | Discharge: 2019-02-25 | Disposition: A | Discharge status: Home / Self Care | Payer: Self-pay | Attending: Obstetrics and Gynecology | Admitting: Obstetrics and Gynecology

## 2019-02-25 ENCOUNTER — Other Ambulatory Visit: Payer: Self-pay

## 2019-02-25 DIAGNOSIS — Z3A11 11 weeks gestation of pregnancy: Secondary | ICD-10-CM | POA: Insufficient documentation

## 2019-02-25 DIAGNOSIS — O039 Complete or unspecified spontaneous abortion without complication: Secondary | ICD-10-CM | POA: Insufficient documentation

## 2019-02-25 DIAGNOSIS — O209 Hemorrhage in early pregnancy, unspecified: Secondary | ICD-10-CM

## 2019-02-25 LAB — URINALYSIS, ROUTINE W REFLEX MICROSCOPIC
Bilirubin Urine: NEGATIVE
Glucose, UA: NEGATIVE mg/dL
Ketones, ur: 20 mg/dL — AB
Leukocytes,Ua: NEGATIVE
Nitrite: NEGATIVE
Protein, ur: NEGATIVE mg/dL
Specific Gravity, Urine: 1.003 — ABNORMAL LOW (ref 1.005–1.030)
pH: 7 (ref 5.0–8.0)

## 2019-02-25 LAB — HCG, QUANTITATIVE, PREGNANCY: hCG, Beta Chain, Quant, S: 1698 m[IU]/mL — ABNORMAL HIGH (ref <5)

## 2019-02-25 LAB — I-STAT BETA HCG BLOOD, ED (MC, WL, AP ONLY): I-stat hCG, quantitative: 1571.9 m[IU]/mL — ABNORMAL HIGH (ref <5)

## 2019-02-25 LAB — WET PREP, GENITAL
Clue Cells Wet Prep HPF POC: NONE SEEN
Sperm: NONE SEEN
Trich, Wet Prep: NONE SEEN
Yeast Wet Prep HPF POC: NONE SEEN

## 2019-02-25 LAB — HEMOGLOBIN AND HEMATOCRIT, BLOOD
HCT: 36.7 % (ref 36.0–46.0)
Hemoglobin: 12.3 g/dL (ref 12.0–15.0)

## 2019-02-25 NOTE — ED Triage Notes (Signed)
Patient reports no menstrual cycle since May, states she has not done home preg-now having vaginal bleeding. Patient alert and oriented, NAD

## 2019-02-25 NOTE — MAU Note (Signed)
Sara Price is a 28 y.o. here in MAU reporting: started bleeding this morning, the bleeding was heavier this AM and is lighter now, wearing a pad and has changed it 2x. Also having abdominal pain, none currently.  LMP: 12/05/18  Onset of complaint: today  Pain score: 0/10  Vitals:   02/25/19 1454  BP: 105/65  Pulse: 74  Resp: 16  Temp: 98.1 F (36.7 C)  SpO2: 100%      Lab orders placed from triage: UA

## 2019-02-25 NOTE — Discharge Instructions (Signed)
Aborto espontáneo °Miscarriage °El aborto espontáneo es la pérdida de un bebé que no ha nacido (feto) antes de la semana 20 del embarazo. La mayor parte de los abortos espontáneos ocurre durante los primeros 3 meses de embarazo. A veces, un aborto ocurre antes de que la mujer sepa que está embarazada. °El aborto espontáneo puede ser una experiencia que afecte emocionalmente a la persona. Si ha sufrido un aborto espontáneo, hable con su médico y hágale las preguntas que tenga sobre el aborto espontáneo, el proceso de duelo y los planes futuros de embarazo. °¿Cuáles son las causas? °Entre las causas de un aborto espontáneo se incluyen las siguientes: °· Problemas genéticos o cromosómicos del feto. Estos problemas impiden que el bebé se desarrolle con normalidad. En general, son el resultado de errores fortuitos que ocurren en la etapa temprana del desarrollo y que no se transmiten de padres a hijos (no se heredan). °· Infección en el cuello del útero. °· Trastornos que afectan el equilibrio hormonal del organismo. °· Problemas en el cuello del útero, como su adelgazamiento y apertura antes de que el embarazo llegue a término (insuficiencia del cuello de útero). °· Problemas en el útero. Estos pueden incluir, entre otros, los siguientes: °? Forma anormal del útero. °? Fibromas en el útero. °? Anormalidades congénitas. Estos son problemas que ya estaban presentes en el nacimiento. °· Ciertas enfermedades crónicas. °· Fumar, beber alcohol o usar drogas. °· Lesiones (traumatismos). °En muchos de los casos, se desconoce la causa de los abortos espontáneos. °¿Cuáles son los signos o los síntomas? °Los síntomas de esta afección incluyen los siguientes: °· Sangrado o manchado vaginal, con o sin cólicos o dolor. °· Dolor o cólicos en el abdomen o en la parte inferior de la espalda. °· Eliminación de líquido, tejidos o coágulos sanguíneos por la vagina. °¿Cómo se diagnostica? °Esta afección se puede diagnosticar en función de  lo siguiente: °· Examen físico. °· Ecografía. °· Análisis de sangre. °· Análisis de orina. °¿Cómo se trata? °En algunos casos, el tratamiento de un aborto espontáneo no es necesario si se eliminan de forma natural todos los tejidos que se encontraban en el útero. Si fuera necesario realizar un tratamiento por esta afección, este puede incluir lo siguiente: °· Dilatación y curetaje (D&C). Mediante este procedimiento, se expande el cuello del útero y se raspan las paredes (endometrio). Esto se realiza solamente si queda tejido del feto o la placenta dentro del cuerpo (aborto espontáneo incompleto). °· Medicamentos, por ejemplo: °? Antibióticos para tratar una infección. °? Medicamentos para ayudar al cuerpo a eliminar los restos de tejido. °? Medicamentos para reducir (contraer) el tamaño del útero. Estos medicamentos se pueden administrar si tiene un sangrado abundante. °Si su factor sanguíneo es Rh negativo y el de su bebé es Rh positivo, usted necesitará una inyección del medicamento llamado inmunoglobulina Rhpara proteger a los bebés futuros de tener problemas con el factor sanguíneo Rh. Los términos "Rh negativo" y "Rh positivo" hacen referencia a la presencia o no en la sangre de una proteína específica que se encuentra en la superficie de los glóbulos rojos (factor Rh). °Siga estas indicaciones en su casa: °Medicamentos ° °· Tome los medicamentos de venta libre y los recetados solamente como se lo haya indicado el médico. °· Si le recetaron antibióticos, tómelos como se lo haya indicado el médico. No deje de tomar los antibióticos aunque comience a sentirse mejor. °· No tome antiinflamatorios no esteroideos (AINE), tales como aspirina e ibuprofeno, a menos que se lo indique el médico. Estos medicamentos pueden provocarle sangrado. °Actividad °· Haga   reposo segn lo indicado. Pregntele al mdico qu actividades son seguras para usted.  Pdale a alguien que la ayude con las responsabilidades familiares y del  hogar durante este tiempo. Instrucciones generales  Lleve un registro de la cantidad y la saturacin de las toallas higinicas que Medical laboratory scientific officer. Anote esta informacin.  Anote la cantidad de tejido o cogulos sanguneos que expulsa por la vagina. Guarde las cantidades grandes de tejidos para que el [employer] examine.  No use tampones, no se haga duchas vaginales ni tenga relaciones sexuales hasta que el mdico la autorice.  Para que usted y su pareja puedan sobrellevar el proceso del duelo, hable con su mdico o busque apoyo psicolgico.  Cuando est lista, visite a su mdico para hablar sobre los pasos importantes que deber seguir en relacin con su salud. Tambin hable [employer] que deber tomar para tener un embarazo saludable en el futuro.  Concurra a todas las visitas de seguimiento como se lo haya indicado el mdico. Esto es importante. Dnde encontrar ms informacin  Colegio Estadounidense de [place] y Careers information officer of Obstetricians and Gynecologists): www.acog.org  Departamento de Salud y [place], [place] (U.S. Department of Health and [employer], Office on [employer]): [place]BeachSigns.[email or web address] Comunquese con un mdico si:  Jaclynn Guarneri o siente escalofros.  Tiene una secrecin vaginal con mal olor.  El sangrado aumenta en vez de disminuir. Solicite ayuda de inmediato si:  Siente calambres intensos o dolor en la espalda o en el abdomen.  Elimina cogulos de sangre o tejido por la vagina del tamao de una nuez o ms grandes.  Necesita ms de una toalla higinica de tamao regular por hora.  Se siente mareada o dbil.  Se desmaya.  Siente una tristeza que la invade o Interior and spatial designer. Resumen  La mayor parte de los abortos espontneos ocurre durante los primeros 86meses de Media planner. En algunos casos, el aborto espontneo ocurre antes de que la mujer sepa que est  [place].  Siga las indicaciones del mdico para el cuidado Financial planner. Concurra a todas las visitas de control.  Para que usted y su pareja puedan sobrellevar el proceso del duelo, hable con su mdico o busque apoyo psicolgico. Esta informacin no tiene Marine scientist el consejo del mdico. Asegrese de hacerle al mdico cualquier pregunta que tenga. Document Released: 04/14/2005 Document Revised: 04/11/2017 Document Reviewed: 04/11/2017 Elsevier Patient Education  [place] sobrellevar la prdida de Nutritional therapist Managing Pregnancy Loss La prdida del Media planner puede ocurrir en cualquier momento durante un Media planner. Generalmente no se conoce la causa. Rara vez se debe a algo que usted hizo. La prdida del Media planner a comienzos del Media planner (Forensic scientist trimestre) se denomina aborto espontneo. Este es el tipo de prdida de un embarazo ms frecuente. La prdida del embarazo que ocurre despus de las 20 semanas de embarazo se denomina muerte fetal si el corazn del beb deja de latir antes del nacimiento. La muerte fetal es mucho menos frecuente. Algunas mujeres experimentan trabajo de parto espontneo poco despus de la muerte fetal, lo que ocasiona el nacimiento del beb muerto. Cualquier prdida de Nutritional therapist puede ser devastadora. Tendr que recuperarse fsica y emocionalmente. La mayora de las mujeres son capaces de quedar embarazadas nuevamente despus de la prdida de un Media planner y dar a luz un beb sano. Cmo manejar la recuperacin emocional  La prdida de un embarazo es [place]  difcil emocionalmente. Es posible que sienta muchas emociones distintas mientras hace el duelo. Puede sentirse triste y Knollcrestenojada. Tambin puede sentir culpa. Es normal tener perodos de llanto. La recuperacin emocional puede llevar ms tiempo que la recuperacin fsica. Es diferente para Advertising account plannercada persona. Estas medidas pueden ayudarla a sobrellevar esta prdida:  Recuerde que es poco probable  que haya hecho algo para causar la prdida del Psychiatristembarazo.  Comparta sus pensamientos y sentimientos con su pareja, familiares y Personnel officeramigos. Recuerde que su pareja tambin se est recuperando emocionalmente.  Asegrese de tener un buen sistema de apoyo. No pase demasiado tiempo sola.  Renase con un consejero sobre prdida de Vineyard Lakeembarazos o nase a un grupo de apoyo para prdida de Insurance underwriterembarazos.  Duerma lo suficiente y siga una dieta sana. Regrese a la actividad fsica con regularidad cuando se haya recuperado fsicamente.  No consuma drogas ni alcohol para controlar sus emociones.  Considere consultar a un profesional de salud mental para que la ayude a Insurance underwriterrecuperarse emocionalmente.  Pdale a un amigo o un ser querido que la ayude a decidir qu hacer con la ropa y los artculos infantiles que recibi para el beb. En el caso de muerte fetal, muchas mujeres se benefician al tomar Caremark Rxmedidas adicionales en el proceso del duelo. Es recomendable que:  Sostenga al beb despus del nacimiento.  Le ponga un nombre al beb.  Solicite un certificado de nacimiento.  Cree un recuerdo, como las [address] South Osteopathyhuellas de las manos o de los pies.  Vista al beb y pida que le tomen una fotografa.  Haga arreglos para Product managerun funeral.  Solicite un bautismo o una bendicin. Los hospitales tienen personal que puede ayudarla a Chief Strategy Officerorganizar todo esto. Cmo reconocer el estrs emocional Es normal tener estrs emocional despus de perder un Psychiatristembarazo. Pero el estrs emocional que dura mucho tiempo o se vuelve grave requiere tratamiento. Tenga cuidado con estos signos de estrs emocional grave:  Tristeza, ira o culpa, que no desaparecen y estn interfiriendo en sus actividades normales.  Problemas de relacin que hayan surgido o empeorado desde la prdida del Psychiatristembarazo.  Signos de depresin que duran ms de 2 semanas. Pueden incluir: ? Tristeza. ? Ansiedad. ? Desesperanza. ? Falta de inters en las actividades que  disfruta. ? Incapacidad para concentrarse. ? Dificultad para dormir o dormir demasiado. ? Prdida del apetito o comer en exceso. ? Pensamientos sobre la muerte o de [place] Resorthacerse dao a s misma. Siga estas instrucciones en su casa:  Use los medicamentos de venta libre y los recetados solamente como se lo haya indicado el mdico.  Haga reposo en su casa hasta que su nivel de energa regrese. Reanude sus actividades normales segn lo indicado por el mdico. Pregntele al mdico qu actividades son seguras para usted.  Cuando est lista, visite a su mdico para hablar Becton, Dickinson and Companysobre las medidas que debe tomar para futuros embarazos.  Concurra a todas las visitas de [address]seguimiento como se lo haya indicado el mdico. Esto es importante. Dnde encontrar apoyo  Para que usted y su pareja puedan sobrellevar el proceso del duelo, hable con su mdico o busque apoyo psicolgico.  Considere la posibilidad de reunirse con otras mujeres que hayan sufrido la prdida de Chartered loss adjusterun embarazo. Consulte al The Procter & Gamblemdico sobre distintos grupos de apoyo y recursos. Dnde buscar ms informacin  U.S. Department of Health and [address] Streetuman Services, Office on [address] BoulevardWomens Health (Departamento de Salud y WyanoServicios Sociales de los [place]Estados Unidos, [place]Oficina de Salud de la Mujer): http://hoffman.com/www.womenshealth.gov  American Pregnancy Association (Asociacin Gwynneth Alimentmericana del  Embarazo): BroadwayMovies.sewww.americanpregnancy.org. Comunquese con un mdico si:  Contina sintiendo pena, tristeza o falta de motivacin para realizar las actividades diarias, y estos sentimientos no mejoran con Museum/gallery conservatorel tiempo.  Tiene problemas para recuperarse emocionalmente, en especial si est consumiendo alcohol o sustancias para ayudar. Solicite ayuda inmediatamente si:  Piensa en lastimarse a usted misma o a Economistotras personas. Si alguna vez siente que puede lastimarse a usted misma o Physicist, medicallastimar a Economistotras personas, o tiene pensamientos de poner fin a su vida, busque ayuda de inmediato. Puede dirigirse al servicio de  emergencias ms cercano o comunicarse con:  El servicio de emergencias de su localidad (911 en EE.UU.).  Una lnea de asistencia al suicida y Visual merchandiseratencin en crisis, como National Suicide Prevention Lifeline (Lnea Nacional de Prevencin del Suicidio), al [phone]. Est disponible las 24 horas del da. Resumen  Cualquier prdida de Chartered loss adjusterun embarazo puede ser difcil fsica y emocionalmente.  Es posible que tenga muchas emociones distintas mientras hace el duelo. La recuperacin emocional puede durar ms tiempo que la recuperacin fsica.  Es normal tener estrs emocional despus de perder Chartered loss adjusterun embarazo. Pero el estrs emocional que dura mucho tiempo o se vuelve grave requiere tratamiento.  Consulte a su mdico si tiene problemas emocionales despus de la prdida de Chartered loss adjusterun embarazo. Esta informacin no tiene Theme park managercomo fin reemplazar el consejo del mdico. Asegrese de hacerle al mdico cualquier pregunta que tenga. Document Released: 10/15/2017 Document Revised: 10/30/2018 Document Reviewed: 10/15/2017 Elsevier Patient Education  2020 ArvinMeritorElsevier Inc.

## 2019-02-25 NOTE — MAU Provider Note (Signed)
Chief Complaint: Vaginal Bleeding   First Provider Initiated Contact with Patient 02/25/19 1546     *Spanish interpreter used for this visit*  SUBJECTIVE HPI: Sara Price is a 28 y.o. G2P1001 at 3814w5d who presents to Maternity Admissions reporting vaginal bleeding and abdominal pain. Has not been seen for care during this pregnancy yet. Bleeding started this morning. Reports heavy amount of dark red blood with some small clots. Bleeding has slowed down somewhat since. Had intense abdominal cramping during episode of heavy bleeding that has since resolved.    Past Medical History:  Diagnosis Date  . Medical history non-contributory    OB History  Gravida Para Term Preterm AB Living  2 1 1     1   SAB TAB Ectopic Multiple Live Births        0 1    # Outcome Date GA Lbr Len/2nd Weight Sex Delivery Anes PTL Lv  2 Current           1 Term 03/15/17 375w1d / 02:11 3400 g M Vag-Spont EPI  LIV   Past Surgical History:  Procedure Laterality Date  . NO PAST SURGERIES     Social History   Socioeconomic History  . Marital status: Married    Spouse name: Not on file  . Number of children: Not on file  . Years of education: Not on file  . Highest education level: Not on file  Occupational History  . Not on file  Social Needs  . Financial resource strain: Not on file  . Food insecurity    Worry: Not on file    Inability: Not on file  . Transportation needs    Medical: Not on file    Non-medical: Not on file  Tobacco Use  . Smoking status: Never Smoker  . Smokeless tobacco: Never Used  Substance and Sexual Activity  . Alcohol use: No  . Drug use: No  . Sexual activity: Yes    Birth control/protection: None  Lifestyle  . Physical activity    Days per week: Not on file    Minutes per session: Not on file  . Stress: Not on file  Relationships  . Social Musicianconnections    Talks on phone: Not on file    Gets together: Not on file    Attends religious service: Not on file     Active member of club or organization: Not on file    Attends meetings of clubs or organizations: Not on file    Relationship status: Not on file  . Intimate partner violence    Fear of current or ex partner: Not on file    Emotionally abused: Not on file    Physically abused: Not on file    Forced sexual activity: Not on file  Other Topics Concern  . Not on file  Social History Narrative  . Not on file   Family History  Problem Relation Age of Onset  . Diabetes Father    No current facility-administered medications on file prior to encounter.    Current Outpatient Medications on File Prior to Encounter  Medication Sig Dispense Refill  . acetaminophen (TYLENOL) 325 MG tablet Take 2 tablets (650 mg total) by mouth every 4 (four) hours as needed (for pain scale < 4). 30 tablet 0  . ibuprofen (ADVIL,MOTRIN) 600 MG tablet Take 1 tablet (600 mg total) by mouth every 6 (six) hours. 30 tablet 0  . Prenatal Vit-Fe Fumarate-FA (PRENATAL VITAMINS) 28-0.8 MG TABS Take 1  tablet by mouth daily.      No Known Allergies  I have reviewed patient's Past Medical Hx, Surgical Hx, Family Hx, Social Hx, medications and allergies.   Review of Systems  Constitutional: Negative.   Gastrointestinal: Positive for abdominal pain.  Genitourinary: Positive for vaginal bleeding.    OBJECTIVE Patient Vitals for the past 24 hrs:  BP Temp Temp src Pulse Resp SpO2 Height Weight  02/25/19 1834 (!) 98/57 - - 63 - - - -  02/25/19 1454 105/65 98.1 F (36.7 C) Oral 74 16 100 % - -  02/25/19 1450 - - - - - - 5\' 2"  (1.575 m) 66 kg   Constitutional: Well-developed, well-nourished female in no acute distress.  Cardiovascular: normal rate & rhythm, no murmur Respiratory: normal rate and effort. Lung sounds clear throughout GI: Abd soft, non-tender, Pos BS x 4. No guarding or rebound tenderness MS: Extremities nontender, no edema, normal ROM Neurologic: Alert and oriented x 4.  GU:     SPECULUM EXAM: NEFG,  apparent POCs sitting in vagina. Minimal dark red blood after removed.     LAB RESULTS Results for orders placed or performed during the hospital encounter of 02/25/19 (from the past 24 hour(s))  I-Stat beta hCG blood, ED     Status: Abnormal   Collection Time: 02/25/19  2:11 PM  Result Value Ref Range   I-stat hCG, quantitative 1,571.9 (H) <5 mIU/mL   Comment 3          Urinalysis, Routine w reflex microscopic     Status: Abnormal   Collection Time: 02/25/19  3:26 PM  Result Value Ref Range   Color, Urine STRAW (A) YELLOW   APPearance CLEAR CLEAR   Specific Gravity, Urine 1.003 (L) 1.005 - 1.030   pH 7.0 5.0 - 8.0   Glucose, UA NEGATIVE NEGATIVE mg/dL   Hgb urine dipstick LARGE (A) NEGATIVE   Bilirubin Urine NEGATIVE NEGATIVE   Ketones, ur 20 (A) NEGATIVE mg/dL   Protein, ur NEGATIVE NEGATIVE mg/dL   Nitrite NEGATIVE NEGATIVE   Leukocytes,Ua NEGATIVE NEGATIVE   RBC / HPF 11-20 0 - 5 RBC/hpf   WBC, UA 0-5 0 - 5 WBC/hpf   Bacteria, UA RARE (A) NONE SEEN   Squamous Epithelial / LPF 0-5 0 - 5  Wet prep, genital     Status: Abnormal   Collection Time: 02/25/19  4:00 PM  Result Value Ref Range   Yeast Wet Prep HPF POC NONE SEEN NONE SEEN   Trich, Wet Prep NONE SEEN NONE SEEN   Clue Cells Wet Prep HPF POC NONE SEEN NONE SEEN   WBC, Wet Prep HPF POC FEW (A) NONE SEEN   Sperm NONE SEEN   Hemoglobin and hematocrit, blood     Status: None   Collection Time: 02/25/19  4:05 PM  Result Value Ref Range   Hemoglobin 12.3 12.0 - 15.0 g/dL   HCT 36.7 36.0 - 46.0 %  hCG, quantitative, pregnancy     Status: Abnormal   Collection Time: 02/25/19  4:05 PM  Result Value Ref Range   hCG, Beta Chain, Quant, S 1,698 (H) <5 mIU/mL    IMAGING US Ob Less Than 14 Weeks With Ob Transvaginal  Result Date: 02/25/2019 CLINICAL DATA:  Pregnant patient with vaginal bleeding. EXAM: OBSTETRIC <14 WK [place] AND TRANSVAGINAL OB US TECHNIQUE: Both transabdominal and transvaginal ultrasound examinations were  performed for complete evaluation of the gestation as well as the maternal uterus, adnexal regions, and pelvic cul-de-sac.  Transvaginal technique was performed to assess early pregnancy. COMPARISON:  None. FINDINGS: Intrauterine gestational sac: Possible early gestational sac. Yolk sac:  Not Visualized. Embryo:  Not Visualized. Cardiac Activity: Not Visualized. MSD: 4.95 mm   5 w   1 d CRL:    mm    w    d                  US EDC: Subchorionic hemorrhage:  None visualized. Maternal uterus/adnexae: Normal in appearance. IMPRESSION: 1. There is a tiny irregular anechoic region in the endometrium with a mean diameter of 4.95 mm. An early gestational sac is a possibility. If this is a gestational sac, it is irregular, raising the possibility of a nonviable pregnancy. A pseudo gestational sac is also a possibility. Since an IUP is not confirmed, a nonvisualized ectopic pregnancy, an early pregnancy, and a recent miscarriage are still all possible. Recommend follow-up beta HCG and imaging as clinically warranted. Electronically Signed   By: Gerome Samavid  Williams III M.D   On: 02/25/2019 17:44    MAU COURSE Orders Placed This Encounter  Procedures  . Wet prep, genital  . US OB LESS THAN 14 WEEKS WITH OB TRANSVAGINAL  . Urinalysis, Routine w reflex microscopic  . Hemoglobin and hematocrit, blood  . hCG, quantitative, pregnancy  . I-Stat beta hCG blood, ED  . Discharge patient   No orders of the defined types were placed in this encounter.   MDM istat in ED = 1571 RH positive  Tissue c/w POCs removed from vagina during spec exam. Will send to pathology.  H/H, HCG quant, & ultrasound pending  Hemoglobin stable Sac in uterus unlikely iugs based on exam findings. Discussed miscarriage with patient & her spouse using the Spanish interpreter. Will send to office for sab f/u.   ASSESSMENT 1. Miscarriage   2. Vaginal bleeding in pregnancy, first trimester     PLAN Discharge home in stable  condition. Bleeding & infection precautions GC/CT pending Msg to [facility] for non stat hcg & SAB f/u Tissue sent to pathology Follow-up Information    [facility].   Specialty: Obstetrics and Gynecology Why: office will call you to schedule your follow up appointments Contact information: [address] Floor, [address] [id number] [place] [phone]         Allergies as of 02/25/2019   No Known Allergies     Medication List    STOP taking these medications   norethindrone 0.35 MG tablet Commonly known as: MICRONOR     TAKE these medications   acetaminophen 325 MG tablet Commonly known as: Tylenol Take 2 tablets (650 mg total) by mouth every 4 (four) hours as needed (for pain scale < 4).   ibuprofen 600 MG tablet Commonly known as: ADVIL Take 1 tablet (600 mg total) by mouth every 6 (six) hours.   Prenatal Vitamins 28-0.8 MG Tabs Take 1 tablet by mouth daily.        Judeth HornLawrence, Verneice Caspers, NP 02/25/2019  6:50 PM

## 2019-02-27 LAB — GC/CHLAMYDIA PROBE AMP (~~LOC~~) NOT AT ARMC
Chlamydia: NEGATIVE
Neisseria Gonorrhea: NEGATIVE

## 2019-03-05 ENCOUNTER — Other Ambulatory Visit: Payer: Self-pay | Admitting: General Practice

## 2019-03-05 DIAGNOSIS — O039 Complete or unspecified spontaneous abortion without complication: Secondary | ICD-10-CM

## 2019-03-06 ENCOUNTER — Other Ambulatory Visit: Payer: Self-pay

## 2019-03-20 ENCOUNTER — Other Ambulatory Visit: Payer: Self-pay

## 2019-03-20 ENCOUNTER — Ambulatory Visit: Payer: Self-pay

## 2019-03-20 DIAGNOSIS — Z5329 Procedure and treatment not carried out because of patient's decision for other reasons: Secondary | ICD-10-CM

## 2019-03-20 DIAGNOSIS — Z91199 Patient's noncompliance with other medical treatment and regimen due to unspecified reason: Secondary | ICD-10-CM

## 2019-03-20 NOTE — Progress Notes (Signed)
Unable to reach patient with given phone number. No other numbers available.  Wende Mott, CNM 03/20/19 8:40 AM

## 2019-03-20 NOTE — Progress Notes (Signed)
8:30a-Called  for [employer] [place] id # [phone], when pt was called immediately rcvd message stating the describer your trying to reach is no longer in service.

## 2019-07-20 NOTE — L&D Delivery Note (Signed)
Patient: Sara Price MRN: [id number]  GBS status: GBS neg, IAP not given  Patient is a 29 y.o. now [id number] s/p NSVD at [redacted]w[redacted]d, who was admitted for SOL. SROM 0h 106m prior to delivery with light meconium fluid.    Delivery Note At 8:50 PM a viable female was delivered via Vaginal, Spontaneous (Presentation: Left Occiput  Anterior).  APGAR: 9, 9; weight see delivery note.   Placenta status: Spontaneous, Intact.  Cord: 3 vessels with the following complications: none.  Cord pH: gases not collected  Head delivered LOA. No nuchal cord present. Shoulder and body delivered in usual fashion. Infant with spontaneous cry, placed on mother's abdomen, dried and bulb suctioned. Cord clamped x 2 after 1-minute delay, and cut by family member. Cord blood drawn. Placenta delivered spontaneously with gentle cord traction. Fundus firm with massage and Pitocin. Perineum inspected and found to have 1st degree perineal laceration, which was found to be hemostatic and did not require repair.  Anesthesia: None Episiotomy: None Lacerations: 1st degree perineal Suture Repair: N/a Est. Blood Loss (mL): 150  Mom to postpartum.  Baby to Couplet care / Skin to Skin.  [name] 06/04/2020, 9:07 PM

## 2019-12-20 LAB — PREGNANCY, URINE: Preg Test, Ur: POSITIVE

## 2020-01-23 ENCOUNTER — Other Ambulatory Visit: Payer: Self-pay | Admitting: *Deleted

## 2020-01-24 ENCOUNTER — Encounter: Payer: Self-pay | Admitting: General Practice

## 2020-01-28 ENCOUNTER — Encounter: Payer: Self-pay | Admitting: General Practice

## 2020-02-05 ENCOUNTER — Other Ambulatory Visit: Payer: Self-pay

## 2020-02-05 ENCOUNTER — Other Ambulatory Visit (HOSPITAL_COMMUNITY)
Admission: RE | Admit: 2020-02-05 | Discharge: 2020-02-05 | Disposition: A | Discharge status: Home / Self Care | Payer: Self-pay | Source: Ambulatory Visit | Attending: Student | Admitting: Student

## 2020-02-05 ENCOUNTER — Encounter: Payer: Self-pay | Admitting: Student

## 2020-02-05 ENCOUNTER — Ambulatory Visit (INDEPENDENT_AMBULATORY_CARE_PROVIDER_SITE_OTHER): Payer: Self-pay | Admitting: Student

## 2020-02-05 DIAGNOSIS — O0990 Supervision of high risk pregnancy, unspecified, unspecified trimester: Secondary | ICD-10-CM | POA: Insufficient documentation

## 2020-02-05 DIAGNOSIS — Z3A22 22 weeks gestation of pregnancy: Secondary | ICD-10-CM

## 2020-02-05 DIAGNOSIS — Z8759 Personal history of other complications of pregnancy, childbirth and the puerperium: Secondary | ICD-10-CM

## 2020-02-05 DIAGNOSIS — O0992 Supervision of high risk pregnancy, unspecified, second trimester: Secondary | ICD-10-CM

## 2020-02-05 LAB — POCT URINALYSIS DIP (DEVICE)
Bilirubin Urine: NEGATIVE
Glucose, UA: NEGATIVE mg/dL
Hgb urine dipstick: NEGATIVE
Ketones, ur: NEGATIVE mg/dL
Leukocytes,Ua: NEGATIVE
Nitrite: NEGATIVE
Protein, ur: NEGATIVE mg/dL
Specific Gravity, Urine: 1.025 (ref 1.005–1.030)
Urobilinogen, UA: 0.2 mg/dL (ref 0.0–1.0)
pH: 7 (ref 5.0–8.0)

## 2020-02-05 NOTE — Progress Notes (Signed)
[facility] [place] scheduled for 02/14/20 @ 1 pm.Pt advised.

## 2020-02-05 NOTE — Progress Notes (Signed)
  Subjective:    Sara Price is being seen today for her first obstetrical visit.  This is a planned pregnancy. She is at [redacted]w[redacted]d gestation. Her obstetrical history is significant for shortened cervix and prometrium in last pregnancy; delivered NSVD at 40 weeks. . Relationship with FOB: spouse, living together. Patient does intend to breast feed. Pregnancy history fully reviewed.  Patient reports no complaints.  Review of Systems:   Review of Systems  Constitutional: Negative.   HENT: Negative.   Respiratory: Negative.   Cardiovascular: Negative.   Gastrointestinal: Negative.   Genitourinary: Negative.   Musculoskeletal: Negative.   Neurological: Negative.   Psychiatric/Behavioral: Negative.     Objective:     BP 106/64   Pulse 71   Wt 147 lb 4.8 oz (66.8 kg)   LMP 08/31/2019   BMI 26.94 kg/m  Physical Exam Cardiovascular:     Rate and Rhythm: Normal rate.  Pulmonary:     Effort: Pulmonary effort is normal.  Abdominal:     General: Abdomen is flat.  Genitourinary:    General: Normal vulva.  Vulva is no lesion.     Vagina: No vaginal discharge.  Musculoskeletal:        General: Normal range of motion.  Skin:    General: Skin is warm and dry.  Neurological:     General: No focal deficit present.     Mental Status: She is alert.     Maternal Exam:  Introitus: Normal vulva. Vulva is negative for lesion.  Vagina is negative for discharge and ulcerations.    Cervis pink with no lesions, no bleeding after Pap.    Assessment:    Pregnancy: G3P1001 Patient Active Problem List   Diagnosis Date Noted  . Supervision of high risk pregnancy, antepartum 02/05/2020  . SVD (spontaneous vaginal delivery) 03/15/2017  . 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 Language barrier 11/29/2016       Plan:     Initial labs drawn. Prenatal vitamins. Problem list reviewed and updated. AFP3 discussed: declined. Role of ultrasound in  pregnancy discussed; fetal survey: requested. Amniocentesis discussed: not indicated. Follow up in 6 weeks. 50% of 30 min visit spent on counseling and coordination of care.  -[place] at [facility] scheduled; CMA to give her blood pressure cuff -2 hour GTT at 28 weeks -declined genetic testing -pap today -In person visit in 3 weeks -Reviewed blood pressure warnings;  signs of when to come to hospital reviewed; welcomed patient to practice.  -Discussed patient's history of cervical shortening with Dr. Alvester Morin, who recommends going off of upcoming [facility] [place] (rather than incurring cost of M[facility] [place]). If cervical length is shortened, will start on prometrium. Baby was born at term so no history of preterm delivery.  Charlesetta Garibaldi [facility] 02/05/2020

## 2020-02-06 LAB — CBC/D/PLT+RPR+RH+ABO+RUB AB...
Antibody Screen: NEGATIVE
Basophils Absolute: 0 10*3/uL (ref 0.0–0.2)
Basos: 0 %
EOS (ABSOLUTE): 0.1 10*3/uL (ref 0.0–0.4)
Eos: 1 %
HCV Ab: <0.1 s/co ratio (ref 0.0–0.9)
HIV Screen 4th Generation wRfx: NONREACTIVE
Hematocrit: 33.5 % — ABNORMAL LOW (ref 34.0–46.6)
Hemoglobin: 11.1 g/dL (ref 11.1–15.9)
Hepatitis B Surface Ag: NEGATIVE
Immature Grans (Abs): 0.2 10*3/uL — ABNORMAL HIGH (ref 0.0–0.1)
Immature Granulocytes: 2 %
Lymphocytes Absolute: 1.5 10*3/uL (ref 0.7–3.1)
Lymphs: 17 %
MCH: 30.9 pg (ref 26.6–33.0)
MCHC: 33.1 g/dL (ref 31.5–35.7)
MCV: 93 fL (ref 79–97)
Monocytes Absolute: 0.4 10*3/uL (ref 0.1–0.9)
Monocytes: 5 %
Neutrophils Absolute: 6.3 10*3/uL (ref 1.4–7.0)
Neutrophils: 75 %
Platelets: 239 10*3/uL (ref 150–450)
RBC: 3.59 x10E6/uL — ABNORMAL LOW (ref 3.77–5.28)
RDW: 13.7 % (ref 11.7–15.4)
RPR Ser Ql: NONREACTIVE
Rh Factor: POSITIVE
Rubella Antibodies, IGG: 10.8 index (ref >0.99)
WBC: 8.4 10*3/uL (ref 3.4–10.8)

## 2020-02-06 LAB — HCV INTERPRETATION

## 2020-02-06 LAB — HEMOGLOBIN A1C
Est. average glucose Bld gHb Est-mCnc: 100 mg/dL
Hgb A1c MFr Bld: 5.1 % (ref 4.8–5.6)

## 2020-02-07 LAB — URINE CULTURE, OB REFLEX

## 2020-02-07 LAB — CYTOLOGY - PAP
Chlamydia: NEGATIVE
Comment: NEGATIVE
Comment: NORMAL
Diagnosis: NEGATIVE
Neisseria Gonorrhea: NEGATIVE

## 2020-02-07 LAB — CULTURE, OB URINE

## 2020-02-20 ENCOUNTER — Encounter: Payer: Self-pay | Admitting: General Practice

## 2020-02-26 ENCOUNTER — Encounter: Payer: Self-pay | Admitting: Student

## 2020-02-26 ENCOUNTER — Ambulatory Visit (INDEPENDENT_AMBULATORY_CARE_PROVIDER_SITE_OTHER): Payer: Self-pay | Admitting: Student

## 2020-02-26 ENCOUNTER — Other Ambulatory Visit: Payer: Self-pay

## 2020-02-26 VITALS — BP 100/65 | HR 75 | Wt 148.3 lb

## 2020-02-26 DIAGNOSIS — O0990 Supervision of high risk pregnancy, unspecified, unspecified trimester: Secondary | ICD-10-CM

## 2020-02-26 DIAGNOSIS — Z3A25 25 weeks gestation of pregnancy: Secondary | ICD-10-CM

## 2020-02-26 MED ORDER — METOCLOPRAMIDE HCL 10 MG PO TABS: 10.0000 mg | ORAL_TABLET | Freq: Four times a day (QID) | ORAL | 1 refills | Status: DC

## 2020-02-26 MED ORDER — FAMOTIDINE 40 MG PO TABS: 40.0000 mg | ORAL_TABLET | Freq: Every day | ORAL | 1 refills | Status: DC

## 2020-02-26 NOTE — Progress Notes (Signed)
   PRENATAL VISIT NOTE  Subjective:  Sara Price is a 29 y.o. G3P1001 at [redacted]w[redacted]d being seen today for ongoing prenatal care.  She is currently monitored for the following issues for this low-risk pregnancy and has Language barrier; Short cervical length during pregnancy in third trimester; SVD (spontaneous vaginal delivery); and Supervision of high risk pregnancy, antepartum on their problem list.  Patient reports Contractions: Not present. Vag. Bleeding: None.  Movement: Present. Denies leaking of fluid.   The following portions of the patient's history were reviewed and updated as appropriate: allergies, current medications, past family history, past medical history, past social history, past surgical history and problem list.   Objective:   Vitals:   02/26/20 1537  BP: 100/65  Pulse: 75  Weight: 148 lb 4.8 oz (67.3 kg)    Fetal Status: Fetal Heart Rate (bpm): 151 Fundal Height: 25 cm Movement: Present     General:  Alert, oriented and cooperative. Patient is in no acute distress.  Skin: Skin is warm and dry. No rash noted.   Cardiovascular: Normal heart rate noted  Respiratory: Normal respiratory effort, no problems with respiration noted  Abdomen: Soft, gravid, appropriate for gestational age.  Pain/Pressure: Present     Pelvic: Cervical exam deferred        Extremities: Normal range of motion.  Edema: None  Mental Status: Normal mood and affect. Normal behavior. Normal judgment and thought content.   Assessment and Plan:  Pregnancy: G3P1001 at [redacted]w[redacted]d  1. [redacted] weeks gestation of pregnancy   2. Supervision of high risk pregnancy, antepartum   -long discussion about COVID vaccine; encouraged patient to get vaccine -discussed upcoming glucola test -reviewed cervical length and [place]; per prior conversation with MD, will not start prometrium now given that patient's cervical length is normal now -Patient doing well; will send RX for pepcid and reglan for patient's occasional  nausea Preterm labor symptoms and general obstetric precautions including but not limited to vaginal bleeding, contractions, leaking of fluid and fetal movement were reviewed in detail with the patient. Please refer to After Visit Summary for other counseling recommendations.   Return in about 4 weeks (around 03/25/2020), or 28 week labs and 2 hour GTT.  Future Appointments  Date Time Provider Department Center  03/28/2020  8:15 AM Kathlene Cote [facility]  03/28/2020  8:50 AM [facility]    Marylene Land, [place]

## 2020-02-26 NOTE — Patient Instructions (Signed)
Prueba de tolerancia a la glucosa durante el embarazo Glucose Tolerance Test During Pregnancy Por qu me debo realizar esta prueba? La prueba de tolerancia a la glucosa (PTG) se realiza para controlar la manera en que el cuerpo procesa el azcar (glucosa). Esta es una de las diferentes pruebas que se usan para diagnosticar la diabetes que se desarrolla durante el embarazo (diabetes mellitus gestacional). La diabetes gestacional es una forma temporal de diabetes que algunas mujeres desarrollan durante el embarazo. Generalmente, se produce durante el segundo trimestre del embarazo y desaparece despus del parto. Los anlisis (pruebas de deteccin) para la diabetes gestacional por lo general se hacen entre las 24 y 28 semanas de embarazo. Se le podr realizar la prueba PTG despus de realizarse una prueba de deteccin de glucosa de 1 hora si los resultados de esa prueba indican que es posible que tenga diabetes gestacional. Tambin pueden hacerle esta prueba si:  Tiene antecedentes de diabetes gestacional.  Tiene antecedentes de haber parido bebs muy grandes o antecedentes de prdida fetal repetida (muerte fetal).  Tiene signos y sntomas de diabetes, tales como: ? Cambios en la visin. ? Hormigueo o adormecimiento en las manos o los pies. ? Cambios en el hambre, la sed y la miccin que no se explican por el embarazo. Qu se analiza? Esta prueba mide la cantidad de glucosa en la sangre en diferentes momentos durante un perodo de 3horas. Esto indica qu tan bien su cuerpo puede procesar la glucosa. Qu tipo de muestra se toma?  Para esta prueba, se extraen muestras de sangre. Por lo general, para extraerlas, se introduce una aguja en un vaso sanguneo. Cmo debo prepararme para esta prueba?  Durante 3 das antes de la prueba, coma normalmente. Coma muchos alimentos con alto contenido de carbohidratos.  Siga las indicaciones del mdico acerca de lo siguiente: ? Restricciones en la comida o  la bebida el da de la prueba. Se le podr pedir que no coma ni beba nada ms que agua (ayuno) desde 8 a 10 horas antes de la prueba. ? Cambiar o suspender los medicamentos que toma habitualmente. Algunos medicamentos pueden interferir en esta prueba. Informe al mdico acerca de lo siguiente:  Todos los medicamentos que utiliza, incluidos vitaminas, hierbas, gotas oftlmicas, cremas y medicamentos de venta libre.  Cualquier enfermedad de la sangre que tenga.  Cirugas a las que se someti.  Cualquier enfermedad que tenga. Qu ocurre durante la prueba? Primero se le medir la glucemia. Esto se denomina glucemia en ayunas, ya que usted hizo ayuno antes de la prueba. Luego beber una solucin de glucosa que contiene una cierta cantidad de glucosa. Se le medir la glucosa en sangre nuevamente 1, 2 y 3 horas despus de beber la solucin. La realizacin de esta prueba lleva alrededor de 3 horas. Durante ese tiempo deber permanecer en el lugar donde se realiza la prueba. Durante el perodo de la prueba:  No coma ni beba nada que no sea la solucin de glucosa.  No haga ejercicios.  No consuma ningn producto que contenga nicotina o tabaco, como cigarrillos y cigarrillos electrnicos. Si necesita ayuda para dejar estos productos, consulte a su mdico. El procedimiento de prueba puede variar segn el mdico y el hospital. Cmo se informan los resultados? Sus resultados se informarn como miligramos de glucosa por decilitro de sangre (mg/dl) o milimoles por litro (mmol/l). El mdico comparar sus resultados con los rangos normales que se establecieron despus de realizar el anlisis a un grupo grande de personas (rangos   de referencia). Los rangos de referencia pueden variar entre laboratorios y hospitales. Los rangos de referencia habituales para esta prueba son los siguientes:  En ayunas: menos de 95 a 105mg/dl (5,3 a 5,8mmol/l).  1 hora despus de beber glucosa: menos de 180 a 190mg/dl (10,0 a  10,5mmol/l).  2 horas despus de beber glucosa: menos de 155 a 165mg/dl (8,6 a 9,2mmol/l).  3 horas despus de beber glucosa: de 140 a 145mg/dl (7,8 a 8,1mmol/l). Qu significan los resultados? Los resultados dentro de los rangos de referencia se consideran normales, lo que significa que sus niveles de glucosa estn bien controlados. Si dos o ms de sus niveles de glucemia estn altos, es posible que le diagnostiquen diabetes gestacional. Si solo un nivel est alto, el mdico podr sugerir repetir el anlisis o hacer otras pruebas para confirmar un diagnstico. Hable con el mdico sobre lo que significan los resultados. Preguntas para hacerle al mdico Consulte a su mdico o pregunte en el departamento donde se realiza la prueba acerca de lo siguiente:  Cundo estarn disponibles mis resultados?  Cmo obtendr mis resultados?  Cules son mis opciones de tratamiento?  Qu otras pruebas necesito?  Cules son los prximos pasos que debo seguir? Resumen  La prueba de tolerancia a la glucosa (PTG) es una de las diferentes pruebas que se usan para diagnosticar la diabetes que se desarrolla durante el embarazo (diabetes mellitus gestacional). La diabetes gestacional es una forma temporal de diabetes que algunas mujeres desarrollan durante el embarazo.  Se le podr realizar la prueba PTG despus de realizarse una prueba de deteccin de glucosa de 1 hora si los resultados de esa prueba indican que es posible que tenga diabetes gestacional. Tambin se le podr realizar esta prueba si tiene algn sntoma o factor de riesgo de diabetes gestacional.  Hable con el mdico sobre lo que significan los resultados. Esta informacin no tiene como fin reemplazar el consejo del mdico. Asegrese de hacerle al mdico cualquier pregunta que tenga. Document Revised: 05/03/2017 Document Reviewed: 05/03/2017 Elsevier Patient Education  2020 Elsevier Inc.  

## 2020-02-27 ENCOUNTER — Other Ambulatory Visit: Payer: Self-pay | Admitting: Student

## 2020-03-27 ENCOUNTER — Other Ambulatory Visit: Payer: Self-pay

## 2020-03-27 DIAGNOSIS — O0990 Supervision of high risk pregnancy, unspecified, unspecified trimester: Secondary | ICD-10-CM

## 2020-03-28 ENCOUNTER — Encounter: Payer: Self-pay | Admitting: Medical

## 2020-03-28 ENCOUNTER — Ambulatory Visit (INDEPENDENT_AMBULATORY_CARE_PROVIDER_SITE_OTHER): Payer: Self-pay | Admitting: Medical

## 2020-03-28 ENCOUNTER — Other Ambulatory Visit: Payer: Self-pay

## 2020-03-28 VITALS — BP 105/69 | HR 67 | Wt 155.7 lb

## 2020-03-28 DIAGNOSIS — O26873 Cervical shortening, third trimester: Secondary | ICD-10-CM

## 2020-03-28 DIAGNOSIS — Z3A30 30 weeks gestation of pregnancy: Secondary | ICD-10-CM

## 2020-03-28 DIAGNOSIS — Z789 Other specified health status: Secondary | ICD-10-CM

## 2020-03-28 DIAGNOSIS — O0990 Supervision of high risk pregnancy, unspecified, unspecified trimester: Secondary | ICD-10-CM

## 2020-03-28 NOTE — Progress Notes (Signed)
   PRENATAL VISIT NOTE  Subjective:  Sara Price is a 29 y.o. G3P1001 at [redacted]w[redacted]d being seen today for ongoing prenatal care.  She is currently monitored for the following issues for this low-risk pregnancy and has Language barrier; Short cervical length during pregnancy in third trimester; and Supervision of high risk pregnancy, antepartum on their problem list.  Patient reports no complaints.  Contractions: Not present. Vag. Bleeding: None.  Movement: Present. Denies leaking of fluid.   The following portions of the patient's history were reviewed and updated as appropriate: allergies, current medications, past family history, past medical history, past social history, past surgical history and problem list.   Objective:   Vitals:   03/28/20 0833  BP: 105/69  Pulse: 67    Fetal Status: Fetal Heart Rate (bpm): 141 Fundal Height: 31 cm Movement: Present     General:  Alert, oriented and cooperative. Patient is in no acute distress.  Skin: Skin is warm and dry. No rash noted.   Cardiovascular: Normal heart rate noted  Respiratory: Normal respiratory effort, no problems with respiration noted  Abdomen: Soft, gravid, appropriate for gestational age.  Pain/Pressure: Absent     Pelvic: Cervical exam deferred        Extremities: Normal range of motion.  Edema: None  Mental Status: Normal mood and affect. Normal behavior. Normal judgment and thought content.   Assessment and Plan:  Pregnancy: G3P1001 at [redacted]w[redacted]d 1. Supervision of high risk pregnancy, antepartum - Doing well - 2 hour GTT, CBC, HIV and RPR today  - Patient provided proof of vaccination for TDAP and Flu from 12/24/19  2. 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 Language barrier - Interpreter present   4. [redacted] weeks gestation of pregnancy  Preterm labor symptoms and general obstetric precautions including but not limited to vaginal bleeding,  contractions, leaking of fluid and fetal movement were reviewed in detail with the patient. Please refer to After Visit Summary for other counseling recommendations.   Return in about 2 weeks (around 04/11/2020) for LOB, In-Person.  No future appointments.  Vonzella Nipple, PA-C

## 2020-03-28 NOTE — Patient Instructions (Signed)
Fetal Movement Counts Patient Name: ________________________________________________ Patient Due Date: ____________________ What is a fetal movement count?  A fetal movement count is the number of times that you feel your baby move during a certain amount of time. This may also be called a fetal kick count. A fetal movement count is recommended for every pregnant woman. You may be asked to start counting fetal movements as early as week 28 of your pregnancy. Pay attention to when your baby is most active. You may notice your baby's sleep and wake cycles. You may also notice things that make your baby move more. You should do a fetal movement count:  When your baby is normally most active.  At the same time each day. A good time to count movements is while you are resting, after having something to eat and drink. How do I count fetal movements? 1. Find a quiet, comfortable area. Sit, or lie down on your side. 2. Write down the date, the start time and stop time, and the number of movements that you felt between those two times. Take this information with you to your health care visits. 3. Write down your start time when you feel the first movement. 4. Count kicks, flutters, swishes, rolls, and jabs. You should feel at least 10 movements. 5. You may stop counting after you have felt 10 movements, or if you have been counting for 2 hours. Write down the stop time. 6. If you do not feel 10 movements in 2 hours, contact your health care provider for further instructions. Your health care provider may want to do additional tests to assess your baby's well-being. Contact a health care provider if:  You feel fewer than 10 movements in 2 hours.  Your baby is not moving like he or she usually does. Date: ____________ Start time: ____________ Stop time: ____________ Movements: ____________ Date: ____________ Start time: ____________ Stop time: ____________ Movements: ____________ Date: ____________  Start time: ____________ Stop time: ____________ Movements: ____________ Date: ____________ Start time: ____________ Stop time: ____________ Movements: ____________ Date: ____________ Start time: ____________ Stop time: ____________ Movements: ____________ Date: ____________ Start time: ____________ Stop time: ____________ Movements: ____________ Date: ____________ Start time: ____________ Stop time: ____________ Movements: ____________ Date: ____________ Start time: ____________ Stop time: ____________ Movements: ____________ Date: ____________ Start time: ____________ Stop time: ____________ Movements: ____________ This information is not intended to replace advice given to you by your health care provider. Make sure you discuss any questions you have with your health care provider. Document Revised: 02/22/2019 Document Reviewed: 02/22/2019 Elsevier Patient Education  2020 Elsevier Inc. Braxton Hicks Contractions Contractions of the uterus can occur throughout pregnancy, but they are not always a sign that you are in labor. You may have practice contractions called Braxton Hicks contractions. These false labor contractions are sometimes confused with true labor. What are Braxton Hicks contractions? Braxton Hicks contractions are tightening movements that occur in the muscles of the uterus before labor. Unlike true labor contractions, these contractions do not result in opening (dilation) and thinning of the cervix. Toward the end of pregnancy (32-34 weeks), Braxton Hicks contractions can happen more often and may become stronger. These contractions are sometimes difficult to tell apart from true labor because they can be very uncomfortable. You should not feel embarrassed if you go to the hospital with false labor. Sometimes, the only way to tell if you are in true labor is for your health care provider to look for changes in the cervix. The health care provider   will do a physical exam and may  monitor your contractions. If you are not in true labor, the exam should show that your cervix is not dilating and your water has not broken. If there are no other health problems associated with your pregnancy, it is completely safe for you to be sent home with false labor. You may continue to have Braxton Hicks contractions until you go into true labor. How to tell the difference between true labor and false labor True labor  Contractions last 30-70 seconds.  Contractions become very regular.  Discomfort is usually felt in the top of the uterus, and it spreads to the lower abdomen and low back.  Contractions do not go away with walking.  Contractions usually become more intense and increase in frequency.  The cervix dilates and gets thinner. False labor  Contractions are usually shorter and not as strong as true labor contractions.  Contractions are usually irregular.  Contractions are often felt in the front of the lower abdomen and in the groin.  Contractions may go away when you walk around or change positions while lying down.  Contractions get weaker and are shorter-lasting as time goes on.  The cervix usually does not dilate or become thin. Follow these instructions at home:   Take over-the-counter and prescription medicines only as told by your health care provider.  Keep up with your usual exercises and follow other instructions from your health care provider.  Eat and drink lightly if you think you are going into labor.  If Braxton Hicks contractions are making you uncomfortable: ? Change your position from lying down or resting to walking, or change from walking to resting. ? Sit and rest in a tub of warm water. ? Drink enough fluid to keep your urine pale yellow. Dehydration may cause these contractions. ? Do slow and deep breathing several times an hour.  Keep all follow-up prenatal visits as told by your health care provider. This is important. Contact a  health care provider if:  You have a fever.  You have continuous pain in your abdomen. Get help right away if:  Your contractions become stronger, more regular, and closer together.  You have fluid leaking or gushing from your vagina.  You pass blood-tinged mucus (bloody show).  You have bleeding from your vagina.  You have low back pain that you never had before.  You feel your baby's head pushing down and causing pelvic pressure.  Your baby is not moving inside you as much as it used to. Summary  Contractions that occur before labor are called Braxton Hicks contractions, false labor, or practice contractions.  Braxton Hicks contractions are usually shorter, weaker, farther apart, and less regular than true labor contractions. True labor contractions usually become progressively stronger and regular, and they become more frequent.  Manage discomfort from Braxton Hicks contractions by changing position, resting in a warm bath, drinking plenty of water, or practicing deep breathing. This information is not intended to replace advice given to you by your health care provider. Make sure you discuss any questions you have with your health care provider. Document Revised: 06/17/2017 Document Reviewed: 11/18/2016 Elsevier Patient Education  2020 Elsevier Inc.  

## 2020-03-29 LAB — GLUCOSE TOLERANCE, 2 HOURS W/ 1HR
Glucose, 1 hour: 150 mg/dL (ref 65–179)
Glucose, 2 hour: 148 mg/dL (ref 65–152)
Glucose, Fasting: 74 mg/dL (ref 65–91)

## 2020-03-29 LAB — CBC
Hematocrit: 33.7 % — ABNORMAL LOW (ref 34.0–46.6)
Hemoglobin: 10.7 g/dL — ABNORMAL LOW (ref 11.1–15.9)
MCH: 28.3 pg (ref 26.6–33.0)
MCHC: 31.8 g/dL (ref 31.5–35.7)
MCV: 89 fL (ref 79–97)
Platelets: 220 10*3/uL (ref 150–450)
RBC: 3.78 x10E6/uL (ref 3.77–5.28)
RDW: 12.8 % (ref 11.7–15.4)
WBC: 10.7 10*3/uL (ref 3.4–10.8)

## 2020-03-29 LAB — HIV ANTIBODY (ROUTINE TESTING W REFLEX): HIV Screen 4th Generation wRfx: NONREACTIVE

## 2020-03-29 LAB — RPR: RPR Ser Ql: NONREACTIVE

## 2020-04-10 ENCOUNTER — Ambulatory Visit (INDEPENDENT_AMBULATORY_CARE_PROVIDER_SITE_OTHER): Payer: Self-pay

## 2020-04-10 ENCOUNTER — Other Ambulatory Visit: Payer: Self-pay

## 2020-04-10 VITALS — BP 105/66 | HR 96 | Wt 160.0 lb

## 2020-04-10 DIAGNOSIS — O0990 Supervision of high risk pregnancy, unspecified, unspecified trimester: Secondary | ICD-10-CM

## 2020-04-10 DIAGNOSIS — Z789 Other specified health status: Secondary | ICD-10-CM

## 2020-04-10 DIAGNOSIS — Z3A31 31 weeks gestation of pregnancy: Secondary | ICD-10-CM

## 2020-04-10 NOTE — Progress Notes (Signed)
   Low-RISK PREGNANCY OFFICE VISIT  Patient name: Sara Price MRN [id number]  Date of birth: October 04, 1990 Chief Complaint:   Routine Prenatal Visit  Subjective:   Sara Price is a 29 y.o. G33P1001 female at [redacted]w[redacted]d with an Estimated Date of Delivery: 06/06/20 being seen today for ongoing management of a low-risk pregnancy aeb has Language barrier; Short cervical length during pregnancy in third trimester; and Supervision of high risk pregnancy, antepartum on their problem list.  Patient presents today without complaints. Patient endorses fetal movement. Patient denies abdominal cramping or contractions as well as vaginal concerns including abnormal discharge, leaking of fluid, and bleeding. Patient considering usage of Nexplanon for BCM.  Contractions: Not present. Vag. Bleeding: None.  Movement: Present.  Reviewed past medical,surgical, social, obstetrical and family history as well as problem list, medications and allergies.  Objective   Vitals:   04/10/20 1446  BP: 105/66  Pulse: 96  Weight: 160 lb (72.6 kg)  Body mass index is 29.26 kg/m.  Total Weight Gain:15 lb (6.804 kg)         Physical Examination:   General appearance: Well appearing, and in no distress  Mental status: Alert, oriented to person, place, and time  Skin: Warm & dry  Cardiovascular: Normal heart rate noted  Respiratory: Normal respiratory effort, no distress  Abdomen: Soft, gravid, nontender, AGA with Fundal height of Fundal Height: 32 cm  Pelvic: Cervical exam deferred           Extremities: Edema: None  Fetal Status: Fetal Heart Rate (bpm): 153  Movement: Present   No results found for this or any previous visit (from the past 24 hour(s)).  Assessment & Plan:  Low-risk pregnancy of a 29 y.o., G3P1001 at [redacted]w[redacted]d with an Estimated Date of Delivery: 06/06/20   1. Supervision of high risk pregnancy, antepartum -Anticipatory guidance for upcoming appts. -Reviewed birth control  methods. -Informational pamphlet given for Nexplanon.   2. Language barrier Status Interpreter Used: Byrd Hesselbach [phone]  3. [redacted] weeks gestation of pregnancy -RTO in 3 weeks      Meds: No orders of the defined types were placed in this encounter.  Labs/procedures today:  Lab Orders  No laboratory test(s) ordered today     Reviewed: Preterm labor symptoms and general obstetric precautions including but not limited to vaginal bleeding, contractions, leaking of fluid and fetal movement were reviewed in detail with the patient.  All questions were answered.  Follow-up: Return in about 3 weeks (around 05/01/2020) for LROB-Spanish Speaking.  No orders of the defined types were placed in this encounter.  Cherre Robins MSN, CNM 04/10/2020

## 2020-04-10 NOTE — Patient Instructions (Signed)
Cmo tomarse la presin arterial How to Take Your Blood Pressure Usted puede tomar su presin arterial en casa con un aparato. Es posible que tenga que tomar su presin arterial en casa:  Para ver si tiene presin arterial elevada (hipertensin).  Para controlar su presin arterial a lo largo del tiempo.  Para asegurarse de que el medicamento para la presin arterial est funcionando. Materiales necesarios: Pension scheme manager un aparato de medicin de la presin arterial o tensimetro. Puede comprar uno en una farmacia o en lnea. Al escoger uno:  Escoja uno que tenga un brazalete.  Escoja uno que se envuelva en la parte superior de su brazo. Debe poder meter nicamente un dedo entre el brazalete y Cabin crew.  No escoja uno que mida su presin arterial en la mueca o el dedo. Su mdico puede sugerirle un tensimetro. Cmo prepararse Evite realizar lo siguiente durante los 30 minutos anteriores a Chief Operating Officer su presin arterial:  Pharmacist, hospital.  Consumir alcohol.  Comer.  Fumar.  Realizar actividad fsica. Cinco minutos antes de controlar su presin arterial:  Orine.  Sintese en una silla de comedor. Evite sentarse en un silln blando o sof.  Est tranquilo. No hable. Cmo tomarse la presin arterial Siga las instrucciones que vienen con el aparato. Si tiene Ambulance person, las instrucciones podran ser las siguientes: 1. Sintese con la espalda recta. 2. Coloque los pies en el piso. No cruce los tobillos o las piernas. 3. Apoye el brazo izquierdo al nivel del corazn. Puede apoyarlo en una mesa, escritorio o silla. 4. Arremnguese. 5. Envuelva la parte superior de su brazo izquierdo con el brazalete. El brazalete debe estar 1 pulgada (2,5 cm) sobre su codo. Es mejor Optometrist brazalete alrededor de la piel [place]. 6. Ajuste el brazalete alrededor de su brazo. Debe poder meter nicamente un dedo entre el brazalete y Cabin crew. 7. Coloque el cable dentro del surco de su  codo. 8. Presione el botn de encendido. 9. Qudese sentado tranquilamente mientras el brazalete se infla y se desinfla. 10. Escriba los nmeros que se muestran en la pantalla. 11. Espere 2o 3 minutos y repita los pasos 1al10. Qu significan los nmeros? Dos nmeros conforman la presin arterial. El primer nmero es la presin sistlica. El segundo nmero es la presin diastlica. Un ejemplo de lectura de presin arterial sera "120 sobre 80" (o 120/80). Si es adulto y no tiene Therapist, music, use esta gua para saber si su presin arterial es normal: Normal  Primer nmero: debajo de 120.  Segundo nmero: debajo de 80. Elevada  Primer nmero: 120-129.  Segundo nmero: debajo de 80. Etapa 1 de hipertensin  Primer nmero: 130-139.  Segundo nmero: 80-89. Etapa 2 de hipertensin  Primer nmero: 140 o ms.  Segundo nmero: 90 o ms. Su presin arterial se encuentra por encima del nivel normal incluso si nicamente el nmero superior o el inferior es mayor que el normal. Siga estas instrucciones en su casa:  Controle su presin arterial con la frecuencia que le indique su mdico.  Lleve el tensimetro a su prxima cita con el mdico. Su mdico: ? Se asegurar de que lo est usando correctamente. ? Se asegurar de que funcione bien.  Se asegurar de que entienda cules deben ser sus nmeros de presin arterial.  Dgale al mdico si sus medicamentos le causan efectos secundarios. Comunquese con un mdico si:  Su presin arterial sigue alta. Solicite ayuda de inmediato si:  Su primer nmero de presin arterial es ms alto  que 180.  Su segundo nmero de presin arterial es ms alto que 120. Esta informacin no tiene Theme park manager el consejo del mdico. Asegrese de hacerle al mdico cualquier pregunta que tenga. Document Revised: 09/08/2016 Document Reviewed: 12/12/2015 Elsevier Patient Education  2020 [employer].

## 2020-04-29 ENCOUNTER — Other Ambulatory Visit: Payer: Self-pay

## 2020-04-29 ENCOUNTER — Ambulatory Visit (INDEPENDENT_AMBULATORY_CARE_PROVIDER_SITE_OTHER): Payer: Self-pay | Admitting: Student

## 2020-04-29 ENCOUNTER — Encounter: Payer: Self-pay | Admitting: Student

## 2020-04-29 VITALS — BP 109/62 | HR 78 | Wt 163.6 lb

## 2020-04-29 DIAGNOSIS — Z23 Encounter for immunization: Secondary | ICD-10-CM

## 2020-04-29 DIAGNOSIS — Z789 Other specified health status: Secondary | ICD-10-CM

## 2020-04-29 DIAGNOSIS — O0990 Supervision of high risk pregnancy, unspecified, unspecified trimester: Secondary | ICD-10-CM

## 2020-04-29 DIAGNOSIS — Z3A34 34 weeks gestation of pregnancy: Secondary | ICD-10-CM

## 2020-04-29 NOTE — Patient Instructions (Signed)
https://www.cdc.gov/vaccines/hcp/vis/vis-statements/tdap.pdf">  °Vacuna Tdap (tétanos, difteria y tos ferina): lo que debe saber °Tdap (Tetanus, Diphtheria, Pertussis) Vaccine: What You Need to Know °1. ¿Por qué vacunarse? °La vacuna Tdap puede prevenir el tétanos, la difteria y la tos ferina. °La difteria y la tos ferina se contagian de persona a persona. El tétanos ingresa al organismo a través de cortes o heridas. °· El TÉTANOS (T) provoca rigidez dolorosa en los músculos. El tétanos puede causar graves problemas de salud, como no poder abrir la boca, tener dificultad para tragar y respirar, o la muerte. °· La DIFTERIA (D) puede causar dificultad para respirar, insuficiencia cardíaca, parálisis o muerte. °· La TOS FERINA (aP) también conocida como “tos convulsa” puede causar tos violenta e incontrolable lo que hace difícil respirar, comer o beber. La tos ferina puede ser muy grave en los bebés y en los niños pequeños, y causar neumonía, convulsiones, daño cerebral o la muerte. En adolescentes y adultos, puede causar pérdida de peso, pérdida del control de la vejiga, desmayos y fracturas de costillas al toser de manera intensa. °2. Vacuna Tdap °La vacuna Tdap es solo para niños de 7 años en adelante, adolescentes y adultos.  °Los adolescentes debe recibir una dosis única de la vacuna Tdap, preferentemente a los 11 o 12 años. °Las mujeres embarazadas deben recibir una dosis de la vacuna Tdap en cada embarazo para proteger al recién nacido de la tos ferina. Los bebés tienen mayor riesgo de sufrir complicaciones graves y potencialmente mortales debido a la tos ferina. °Los adultos que nunca recibieron la vacuna Tdap deben recibir una dosis. °Además, los adultos deben recibir una dosis de refuerzo cada 10 años, o antes si la persona sufre una quemadura o una herida grave y sucia. Las dosis de refuerzo pueden ser de la vacuna Tdap o la Td (una vacuna diferente que protege contra el tétanos y la difteria, pero no contra  la tos ferina). °La vacuna Tdap puede ser administrada al mismo tiempo que otras vacunas. °3. Hable con el médico °Comuníquese con la persona que le coloca las vacunas si la persona que la recibe: °· Ha tenido una reacción alérgica después de una dosis anterior de cualquier vacuna contra el tétanos, la difteria o la tos ferina, o cualquier alergia grave, potencialmente mortal. °· Ha tenido un coma, disminución del nivel de la conciencia o convulsiones prolongadas dentro de los 7 días posteriores a una dosis anterior de cualquier vacuna contra la tos ferina (DTP, DTaP o Tdap). °· Tiene convulsiones u otro problema del sistema nervioso. °· Alguna vez tuvo síndrome de Guillain-Barré (también llamado SGB). °· Ha tenido dolor intenso o hinchazón después de una dosis anterior de cualquier vacuna contra el tétanos o la difteria. °En algunos casos, es posible que el médico decida posponer la aplicación de la vacuna Tdap para una visita en el futuro.  °Las personas que sufren trastornos menores, como un resfrío, pueden vacunarse. Las personas que tienen enfermedades moderadas o graves generalmente deben esperar hasta recuperarse para poder recibir la vacuna Tdap.  °Su médico puede darle más información. °4. Riesgos de una reacción a la vacuna °· Después de recibir la vacuna Tdap a veces se puede tener dolor, enrojecimiento o hinchazón en el lugar donde se aplicó la inyección, fiebre leve, dolor de cabeza, sensación de cansancio y náuseas, vómitos, diarrea o dolor de estómago. °Las personas a veces se desmayan después de procedimientos médicos, incluida la vacunación. Informe al médico si se siente mareado, tiene cambios en la visión o zumbidos   en los oídos.  °Al igual que con cualquier medicamento, existe una probabilidad muy remota de que una vacuna cause una reacción alérgica grave, otra lesión grave o la muerte. °5. ¿Qué pasa si se presenta un problema grave? °Podría producirse una reacción alérgica después de que la  persona vacunada abandone la clínica. Si observa signos de una reacción alérgica grave (ronchas, hinchazón de la cara y la garganta, dificultad para respirar, latidos cardíacos acelerados, mareos o debilidad), llame al 9-1-1 y lleve a la persona al hospital más cercano. °Si se presentan otros signos que le preocupan, comuníquese con su médico.  °Las reacciones adversas deben informarse al Sistema de Informe de Eventos Adversos de Vacunas (Vaccine Adverse Event Reporting System, VAERS). Por lo general, el médico presenta este informe o puede hacerlo usted mismo. Visite el sitio web del VAERS en www.vaers.hhs.gov o llame al 1-800-822-7967. El VAERS es solo para informar reacciones; su personal no proporciona asesoramiento médico. °6. Programa Nacional de Compensación de Daños por Vacunas °El Programa Nacional de Compensación de Daños por Vacunas (National Vaccine Injury Compensation Program, VICP) es un programa federal que fue creado para compensar a las personas que puedan haber sufrido daños al recibir ciertas vacunas. Visite el sitio web del VICP en www.hrsa.gov/vaccinecompensation o llame al 1-800-338-2382 para obtener más información acerca del programa y de cómo presentar un reclamo. Hay un límite de tiempo para presentar un reclamo de compensación. °7. ¿Cómo puedo obtener más información? °· Pregúntele a su médico. °· Comuníquese con el servicio de salud de su localidad o su estado. °· Comuníquese con los Centers for Disease Control and Prevention, CDC (Centros para el Control y la Prevención de Enfermedades): °? Llame al 1-800-232-4636 (1-800-CDC-INFO) o °? Visite el sitio web de los CDC en www.cdc.gov/vaccines °Declaración de información de la vacuna Tdap (tétanos, difteria y tos ferina) (07/22/2018) °Esta información no tiene como fin reemplazar el consejo del médico. Asegúrese de hacerle al médico cualquier pregunta que tenga. °Document Revised: 11/14/2018 Document Reviewed: 11/14/2018 °Elsevier Patient  Education © 2020 Elsevier Inc. ° ° °

## 2020-04-29 NOTE — Progress Notes (Signed)
   PRENATAL VISIT NOTE  Subjective:  Sara Price is a 29 y.o. G3P1011 at [redacted]w[redacted]d being seen today for ongoing prenatal care.  She is currently monitored for the following issues for this low-risk pregnancy and has Language barrier and Supervision of high risk pregnancy, antepartum on their problem list.  Patient reports no complaints.  Contractions: Not present. Vag. Bleeding: None.  Movement: Present. Denies leaking of fluid.   The following portions of the patient's history were reviewed and updated as appropriate: allergies, current medications, past family history, past medical history, past social history, past surgical history and problem list.   Objective:   Vitals:   04/29/20 1326  BP: 109/62  Pulse: 78  Weight: 163 lb 9.6 oz (74.2 kg)    Fetal Status: Fetal Heart Rate (bpm): 158 Fundal Height: 34 cm Movement: Present     General:  Alert, oriented and cooperative. Patient is in no acute distress.  Skin: Skin is warm and dry. No rash noted.   Cardiovascular: Normal heart rate noted  Respiratory: Normal respiratory effort, no problems with respiration noted  Abdomen: Soft, gravid, appropriate for gestational age.  Pain/Pressure: Absent     Pelvic: Cervical exam deferred        Extremities: Normal range of motion.  Edema: None  Mental Status: Normal mood and affect. Normal behavior. Normal judgment and thought content.   Assessment and Plan:  Pregnancy: G3P1011 at [redacted]w[redacted]d 1. Supervision of high risk pregnancy, antepartum - TDAP & flu vaccines given too early at [facility] (June). Patient accepted flu vaccine today but would like to wait until next visit for tdap.  - Flu Vaccine QUAD 36+ mos IM  -COVID-19 Vaccine Counseling: The patient was counseled on the potential benefits and lack of known risks of COVID vaccination, during pregnancy and breastfeeding, during today's visit. The patient's questions and concerns were addressed today, including safety of the vaccination and  potential side effects as they have been published by ACOG and SMFM. The patient has been informed that there have not been any documented vaccine related injuries, deaths or birth defects to infant or mom after receiving the COVID-19 vaccine to date. The patient has been made aware that although she is not at increased risk of contracting COVID-19 during pregnancy, she is at increased risk of developing severe disease and complications if she contracts COVID-19 while pregnant. All patient questions were addressed during our visit today. The patient is not planning to get vaccinated at this time.   2. Language barrier -Spanish interpreter in person for this encounter  3. [redacted] weeks gestation of pregnancy   Preterm labor symptoms and general obstetric precautions including but not limited to vaginal bleeding, contractions, leaking of fluid and fetal movement were reviewed in detail with the patient. Please refer to After Visit Summary for other counseling recommendations.   Return in about 2 weeks (around 05/13/2020) for Routine OB in person.  No future appointments.  Judeth Horn, NP

## 2020-05-16 ENCOUNTER — Other Ambulatory Visit: Payer: Self-pay

## 2020-05-16 ENCOUNTER — Other Ambulatory Visit (HOSPITAL_COMMUNITY)
Admission: RE | Admit: 2020-05-16 | Discharge: 2020-05-16 | Disposition: A | Discharge status: Home / Self Care | Payer: Self-pay | Source: Ambulatory Visit | Attending: Medical | Admitting: Medical

## 2020-05-16 ENCOUNTER — Ambulatory Visit (INDEPENDENT_AMBULATORY_CARE_PROVIDER_SITE_OTHER): Payer: Self-pay | Admitting: Medical

## 2020-05-16 VITALS — BP 113/65 | HR 84 | Wt 167.0 lb

## 2020-05-16 DIAGNOSIS — Z789 Other specified health status: Secondary | ICD-10-CM

## 2020-05-16 DIAGNOSIS — O0990 Supervision of high risk pregnancy, unspecified, unspecified trimester: Secondary | ICD-10-CM | POA: Insufficient documentation

## 2020-05-16 DIAGNOSIS — Z3A37 37 weeks gestation of pregnancy: Secondary | ICD-10-CM

## 2020-05-16 NOTE — Progress Notes (Signed)
   PRENATAL VISIT NOTE  Subjective:  Sara Price is a 29 y.o. G3P1011 at [redacted]w[redacted]d being seen today for ongoing prenatal care.  She is currently monitored for the following issues for this low-risk pregnancy and has Language barrier and Supervision of high risk pregnancy, antepartum on their problem list.  Patient reports no complaints.  Contractions: Not present. Vag. Bleeding: None.  Movement: Present. Denies leaking of fluid.   The following portions of the patient's history were reviewed and updated as appropriate: allergies, current medications, past family history, past medical history, past social history, past surgical history and problem list.   Objective:   Vitals:   05/16/20 1003  BP: 113/65  Pulse: 84  Weight: 167 lb (75.8 kg)    Fetal Status: Fetal Heart Rate (bpm): 140 Fundal Height: 36 cm Movement: Present  Presentation: Vertex  General:  Alert, oriented and cooperative. Patient is in no acute distress.  Skin: Skin is warm and dry. No rash noted.   Cardiovascular: Normal heart rate noted  Respiratory: Normal respiratory effort, no problems with respiration noted  Abdomen: Soft, gravid, appropriate for gestational age.  Pain/Pressure: Absent     Pelvic: Cervical exam performed in the presence of a chaperone Dilation: Closed Effacement (%): 50 Station: -3  Extremities: Normal range of motion.  Edema: None  Mental Status: Normal mood and affect. Normal behavior. Normal judgment and thought content.   Assessment and Plan:  Pregnancy: G3P1011 at [redacted]w[redacted]d 1. Supervision of high risk pregnancy, antepartum - Culture, beta strep (group b only) - GC/Chlamydia probe amp ([place])not at [facility]  2. Language barrier - Interpreter present   3. [redacted] weeks gestation of pregnancy  Term labor symptoms and general obstetric precautions including but not limited to vaginal bleeding, contractions, leaking of fluid and fetal movement were reviewed in detail with the  patient. Please refer to After Visit Summary for other counseling recommendations.   Return in about 1 week (around 05/23/2020) for LOB, In-Person, any provider.  No future appointments.  Vonzella Nipple, PA-C

## 2020-05-16 NOTE — Patient Instructions (Signed)
Fetal Movement Counts Patient Name: ________________________________________________ Patient Due Date: ____________________ What is a fetal movement count?  A fetal movement count is the number of times that you feel your baby move during a certain amount of time. This may also be called a fetal kick count. A fetal movement count is recommended for every pregnant woman. You may be asked to start counting fetal movements as early as week 28 of your pregnancy. Pay attention to when your baby is most active. You may notice your baby's sleep and wake cycles. You may also notice things that make your baby move more. You should do a fetal movement count:  When your baby is normally most active.  At the same time each day. A good time to count movements is while you are resting, after having something to eat and drink. How do I count fetal movements? 1. Find a quiet, comfortable area. Sit, or lie down on your side. 2. Write down the date, the start time and stop time, and the number of movements that you felt between those two times. Take this information with you to your health care visits. 3. Write down your start time when you feel the first movement. 4. Count kicks, flutters, swishes, rolls, and jabs. You should feel at least 10 movements. 5. You may stop counting after you have felt 10 movements, or if you have been counting for 2 hours. Write down the stop time. 6. If you do not feel 10 movements in 2 hours, contact your health care provider for further instructions. Your health care provider may want to do additional tests to assess your baby's well-being. Contact a health care provider if:  You feel fewer than 10 movements in 2 hours.  Your baby is not moving like he or she usually does. Date: ____________ Start time: ____________ Stop time: ____________ Movements: ____________ Date: ____________ Start time: ____________ Stop time: ____________ Movements: ____________ Date: ____________  Start time: ____________ Stop time: ____________ Movements: ____________ Date: ____________ Start time: ____________ Stop time: ____________ Movements: ____________ Date: ____________ Start time: ____________ Stop time: ____________ Movements: ____________ Date: ____________ Start time: ____________ Stop time: ____________ Movements: ____________ Date: ____________ Start time: ____________ Stop time: ____________ Movements: ____________ Date: ____________ Start time: ____________ Stop time: ____________ Movements: ____________ Date: ____________ Start time: ____________ Stop time: ____________ Movements: ____________ This information is not intended to replace advice given to you by your health care provider. Make sure you discuss any questions you have with your health care provider. Document Revised: 02/22/2019 Document Reviewed: 02/22/2019 Elsevier Patient Education  2020 Elsevier Inc. Braxton Hicks Contractions Contractions of the uterus can occur throughout pregnancy, but they are not always a sign that you are in labor. You may have practice contractions called Braxton Hicks contractions. These false labor contractions are sometimes confused with true labor. What are Braxton Hicks contractions? Braxton Hicks contractions are tightening movements that occur in the muscles of the uterus before labor. Unlike true labor contractions, these contractions do not result in opening (dilation) and thinning of the cervix. Toward the end of pregnancy (32-34 weeks), Braxton Hicks contractions can happen more often and may become stronger. These contractions are sometimes difficult to tell apart from true labor because they can be very uncomfortable. You should not feel embarrassed if you go to the hospital with false labor. Sometimes, the only way to tell if you are in true labor is for your health care provider to look for changes in the cervix. The health care provider   will do a physical exam and may  monitor your contractions. If you are not in true labor, the exam should show that your cervix is not dilating and your water has not broken. If there are no other health problems associated with your pregnancy, it is completely safe for you to be sent home with false labor. You may continue to have Braxton Hicks contractions until you go into true labor. How to tell the difference between true labor and false labor True labor  Contractions last 30-70 seconds.  Contractions become very regular.  Discomfort is usually felt in the top of the uterus, and it spreads to the lower abdomen and low back.  Contractions do not go away with walking.  Contractions usually become more intense and increase in frequency.  The cervix dilates and gets thinner. False labor  Contractions are usually shorter and not as strong as true labor contractions.  Contractions are usually irregular.  Contractions are often felt in the front of the lower abdomen and in the groin.  Contractions may go away when you walk around or change positions while lying down.  Contractions get weaker and are shorter-lasting as time goes on.  The cervix usually does not dilate or become thin. Follow these instructions at home:   Take over-the-counter and prescription medicines only as told by your health care provider.  Keep up with your usual exercises and follow other instructions from your health care provider.  Eat and drink lightly if you think you are going into labor.  If Braxton Hicks contractions are making you uncomfortable: ? Change your position from lying down or resting to walking, or change from walking to resting. ? Sit and rest in a tub of warm water. ? Drink enough fluid to keep your urine pale yellow. Dehydration may cause these contractions. ? Do slow and deep breathing several times an hour.  Keep all follow-up prenatal visits as told by your health care provider. This is important. Contact a  health care provider if:  You have a fever.  You have continuous pain in your abdomen. Get help right away if:  Your contractions become stronger, more regular, and closer together.  You have fluid leaking or gushing from your vagina.  You pass blood-tinged mucus (bloody show).  You have bleeding from your vagina.  You have low back pain that you never had before.  You feel your baby's head pushing down and causing pelvic pressure.  Your baby is not moving inside you as much as it used to. Summary  Contractions that occur before labor are called Braxton Hicks contractions, false labor, or practice contractions.  Braxton Hicks contractions are usually shorter, weaker, farther apart, and less regular than true labor contractions. True labor contractions usually become progressively stronger and regular, and they become more frequent.  Manage discomfort from Braxton Hicks contractions by changing position, resting in a warm bath, drinking plenty of water, or practicing deep breathing. This information is not intended to replace advice given to you by your health care provider. Make sure you discuss any questions you have with your health care provider. Document Revised: 06/17/2017 Document Reviewed: 11/18/2016 Elsevier Patient Education  2020 Elsevier Inc.  

## 2020-05-19 LAB — GC/CHLAMYDIA PROBE AMP (~~LOC~~) NOT AT ARMC
Chlamydia: NEGATIVE
Comment: NEGATIVE
Comment: NORMAL
Neisseria Gonorrhea: NEGATIVE

## 2020-05-20 LAB — CULTURE, BETA STREP (GROUP B ONLY): Strep Gp B Culture: NEGATIVE

## 2020-05-21 ENCOUNTER — Other Ambulatory Visit: Payer: Self-pay

## 2020-05-21 ENCOUNTER — Ambulatory Visit (INDEPENDENT_AMBULATORY_CARE_PROVIDER_SITE_OTHER): Payer: Self-pay | Admitting: Advanced Practice Midwife

## 2020-05-21 ENCOUNTER — Encounter: Payer: Self-pay | Admitting: Advanced Practice Midwife

## 2020-05-21 VITALS — BP 104/65 | HR 83 | Wt 168.0 lb

## 2020-05-21 DIAGNOSIS — Z3A37 37 weeks gestation of pregnancy: Secondary | ICD-10-CM

## 2020-05-21 DIAGNOSIS — O0990 Supervision of high risk pregnancy, unspecified, unspecified trimester: Secondary | ICD-10-CM

## 2020-05-21 NOTE — Progress Notes (Signed)
   PRENATAL VISIT NOTE  Subjective:  Sara Price is a 29 y.o. G3P1011 at [redacted]w[redacted]d being seen today for ongoing prenatal care.  She is currently monitored for the following issues for this high-risk pregnancy and has Language barrier and Supervision of high risk pregnancy, antepartum on their problem list.  Patient reports no complaints.  Contractions: Not present. Vag. Bleeding: None.  Movement: Present. Denies leaking of fluid.   The following portions of the patient's history were reviewed and updated as appropriate: allergies, current medications, past family history, past medical history, past social history, past surgical history and problem list.   Objective:   Vitals:   05/21/20 0938  BP: 104/65  Pulse: 83  Weight: 168 lb (76.2 kg)    Fetal Status: Fetal Heart Rate (bpm): 134   Movement: Present     General:  Alert, oriented and cooperative. Patient is in no acute distress.  Skin: Skin is warm and dry. No rash noted.   Cardiovascular: Normal heart rate noted  Respiratory: Normal respiratory effort, no problems with respiration noted  Abdomen: Soft, gravid, appropriate for gestational age.  Pain/Pressure: Absent     Pelvic: Cervical exam deferred        Extremities: Normal range of motion.  Edema: None  Mental Status: Normal mood and affect. Normal behavior. Normal judgment and thought content.   Assessment and Plan:  Pregnancy: G3P1011 at [redacted]w[redacted]d 1. Supervision of high risk pregnancy, antepartum   2. [redacted] weeks gestation of pregnancy - baby is slightly oblique today with head to maternal right.   Term labor symptoms and general obstetric precautions including but not limited to vaginal bleeding, contractions, leaking of fluid and fetal movement were reviewed in detail with the patient. Please refer to After Visit Summary for other counseling recommendations.   Return in about 1 week (around 05/28/2020).  Future Appointments  Date Time Provider Department Center    05/28/2020  9:15 AM Warden Fillers, MD [facility]  06/04/2020  9:15 AM Warden Fillers, MD [facility]  06/04/2020 10:15 AM [facility]   Thressa Sheller DNP, CNM  05/21/20  10:48 AM

## 2020-05-28 ENCOUNTER — Other Ambulatory Visit: Payer: Self-pay

## 2020-05-28 ENCOUNTER — Ambulatory Visit (INDEPENDENT_AMBULATORY_CARE_PROVIDER_SITE_OTHER): Payer: Self-pay | Admitting: Obstetrics and Gynecology

## 2020-05-28 VITALS — BP 105/66 | HR 76 | Wt 169.1 lb

## 2020-05-28 DIAGNOSIS — Z3A38 38 weeks gestation of pregnancy: Secondary | ICD-10-CM

## 2020-05-28 DIAGNOSIS — O0990 Supervision of high risk pregnancy, unspecified, unspecified trimester: Secondary | ICD-10-CM

## 2020-05-28 DIAGNOSIS — Z789 Other specified health status: Secondary | ICD-10-CM

## 2020-05-28 NOTE — Patient Instructions (Signed)
induction Induccin del trabajo de parto Labor Induction  Se denomina induccin del trabajo de parto cuando se inician acciones para hacer que una mujer embarazada comience el [place]. La [employer] de las mujeres comienzan el trabajo de parto de forma natural entre las semanas 37 y 42 del Psychiatrist. Cuando esto no ocurre o cuando por necesidad mdica se debe iniciarlo por otros medios, se podran Insurance underwriter. La induccin del trabajo de parto hace que el tero se contraiga. Tambin hace que el cuello uterino se ablande (madure), se abra (se dilate), y se afine (se borre). Generalmente, el trabajo de parto no se induce antes de [address], excepto que haya un motivo mdico para Media planner. El mdico determinar si se debe inducir el [place]. Antes de inducir el trabajo de [place], el mdico considerar ciertos factores; entre ellos:  Su cuadro clnico y el del beb.  En cul semana del embarazo se encuentra.  La madurez de los pulmones del beb.  El estado del cuello uterino.  La posicin del beb.  El tamao del canal del parto. Cules son algunos de los motivos para inducir un parto? Se podra inducir un parto en los siguientes casos:  Su salud o la salud del beb estn en riesgo.  El embarazo se pas de trmino 1semana o ms.  Rompi la bolsa, pero no se inici el trabajo de parto de forma natural.  La cantidad de lquido amnitico que rodea al beb es poca. Tambin podra optar por (elegir) que le induzcan el trabajo de parto en un determinado momento. Por lo general, la induccin del trabajo de parto por eleccin no se hace antes de las 39semanas del [place]. Qu mtodos se usan para inducir el [place]? Los mtodos utilizados para inducir el trabajo de parto incluyen los siguientes:  Administracin de prostaglandina. Este medicamento hace que se inicien las contracciones y que el cuello uterino se dilate y [place]. Puede tomarse por boca (de forma  oral) o introducirse en la vagina (supositorio).  Insercin de un pequeo tubo delgado (catter) que tiene un baln en el extremo en la vagina; luego, expansin del baln con agua para dilatar el cuello uterino.  Ruptura de las [place]. En este mtodo, el mdico separa, con cuidado, el tejido del saco amnitico del cuello uterino. En consecuencia, el cuello uterino se expande, lo que, a la vez, provoca la liberacin de una hormona llamada progesterona. Esta hormona hace que el tero se contraiga. Este procedimiento suele Psychologist, educational del mdico; luego, la enviarn a su hogar para esperar a que Development worker, community.  Romper la bolsa de las aguas. En este mtodo, el mdico [place] un pequeo instrumento para hacer un pequeo orificio en el saco amnitico. Al cabo de un tiempo, esto har que el saco amnitico se rompa. Las contracciones deberan comenzar algunas horas despus.  Medicamentos que desencadenen o intensifiquen las contracciones. Estos se administran a travs de una va intravenosa (IV) que se coloca en una vena del brazo. Excepto la ruptura de las [place], que puede realizarse en una [place], la induccin del [place] de parto se realiza en el hospital para que puedan controlarlos con atencin a usted y al beb. Cunto tiempo lleva inducir el [place] de parto? La duracin del proceso de induccin depende de la preparacin del cuerpo para el trabajo de [place]. Algunas inducciones pueden durar hasta 2 o 3das, mientras que otras podran durar menos de Civil engineer, contracting. La induccin podra durar ms en  los siguientes casos:  La induccin se hace en una etapa temprana del embarazo.  Es Engineer, agricultural de la [place].  El cuello uterino no est listo. Cules son algunos de los riesgos asociados con la induccin del [place] de [place]? Algunos de los riesgos asociados con la induccin del [place] de parto son los siguientes:  Cambios en la frecuencia cardaca fetal, por ejemplo,  los latidos son demasiado rpidos o lentos, o son irregulares (errticos).  Fracaso de la induccin.  Infeccin en la madre o el beb.  Aumento de la posibilidad de que sea necesaria una cesrea.  Muerte fetal.  Ruptura (desprendimiento) de la placenta del tero (raro).  Ruptura del tero (muy poco frecuente). Cuando es Passenger transport manager una induccin por motivos mdicos, los beneficios suelen [employer]. Cules son algunos de los motivos para no inducir el [place]? La induccin no debe realizarse si:  El beb no tolera las contracciones.  Se someti anteriormente a cirugas en el tero, como una miomectoma, le extirparon fibromas o tiene una cicatriz vertical de un parto por cesrea anterior.  La placenta est en una posicin muy baja en el tero y obstruye la abertura del cuello uterino (placenta previa).  El beb no est ubicado con la [employer].  El cordn umbilical cae hacia el canal del parto, adelante del beb.  Hay circunstancias poco habituales, como que se encuentre en una etapa muy temprana del embarazo (beb prematuro).  Tuvo ms de 2partos por cesrea anteriormente. Resumen  Se denomina induccin del trabajo de parto cuando se inician acciones para hacer que una mujer embarazada comience el [place].  La induccin del trabajo de parto hace que el tero se contraiga. Tambin hace que el cuello uterino se [place], se dilate y se borre.  El [place] de parto no se induce antes de las 39semanas de [place], excepto que haya un motivo mdico para Media planner.  Cuando es Passenger transport manager una induccin por motivos mdicos, los beneficios suelen [employer]. Esta informacin no tiene Theme park manager el consejo del mdico. Asegrese de hacerle al mdico cualquier pregunta que tenga. Document Revised: 03/15/2017 Document Reviewed: 03/15/2017 Elsevier Patient Education  2020 [employer].

## 2020-05-28 NOTE — Progress Notes (Signed)
   PRENATAL VISIT NOTE  Subjective:  Sara Price is a 29 y.o. G3P1011 at [redacted]w[redacted]d being seen today for ongoing prenatal care.  She is currently monitored for the following issues for this low-risk pregnancy and has Language barrier; Supervision of high risk pregnancy, antepartum; and [redacted] weeks gestation of pregnancy on their problem list.  Patient doing well with no acute concerns today. She reports no complaints.  Contractions: Not present. Vag. Bleeding: None.  Movement: Present. Denies leaking of fluid.   Chart reviewed, pt is adopt -a-mom.  Cannot find any high risk parameters in the chart  The following portions of the patient's history were reviewed and updated as appropriate: allergies, current medications, past family history, past medical history, past social history, past surgical history and problem list. Problem list updated.  Objective:   Vitals:   05/28/20 0916  BP: 105/66  Pulse: 76  Weight: 169 lb 1.6 oz (76.7 kg)    Fetal Status: Fetal Heart Rate (bpm): 141   Movement: Present     General:  Alert, oriented and cooperative. Patient is in no acute distress.  Skin: Skin is warm and dry. No rash noted.   Cardiovascular: Normal heart rate noted  Respiratory: Normal respiratory effort, no problems with respiration noted  Abdomen: Soft, gravid, appropriate for gestational age.  Pain/Pressure: Absent     Pelvic: Cervical exam deferred        Extremities: Normal range of motion.  Edema: None  Mental Status:  Normal mood and affect. Normal behavior. Normal judgment and thought content.   Assessment and Plan:  Pregnancy: G3P1011 at [redacted]w[redacted]d  1. Language barrier Interpreter present  2. Supervision of high risk pregnancy, antepartum IOL scheduled for 41 weeks, orders placed  3. [redacted] weeks gestation of pregnancy   Term labor symptoms and general obstetric precautions including but not limited to vaginal bleeding, contractions, leaking of fluid and fetal movement were  reviewed in detail with the patient.  Please refer to After Visit Summary for other counseling recommendations.   Return in about 1 week (around 06/04/2020) for [facility], in person.   Mariel Aloe, MD

## 2020-05-28 NOTE — Progress Notes (Signed)
r 

## 2020-05-29 ENCOUNTER — Other Ambulatory Visit (HOSPITAL_COMMUNITY): Payer: Self-pay | Admitting: Advanced Practice Midwife

## 2020-05-29 ENCOUNTER — Encounter (HOSPITAL_COMMUNITY): Payer: Self-pay | Admitting: *Deleted

## 2020-05-29 ENCOUNTER — Telehealth (HOSPITAL_COMMUNITY): Payer: Self-pay | Admitting: *Deleted

## 2020-05-29 NOTE — Telephone Encounter (Signed)
[id number] interpreter number  Preadmission screen

## 2020-06-04 ENCOUNTER — Other Ambulatory Visit: Payer: Self-pay

## 2020-06-04 ENCOUNTER — Encounter: Payer: Self-pay | Admitting: *Deleted

## 2020-06-04 ENCOUNTER — Encounter (HOSPITAL_COMMUNITY): Payer: Self-pay | Admitting: Obstetrics and Gynecology

## 2020-06-04 ENCOUNTER — Inpatient Hospital Stay (HOSPITAL_COMMUNITY)
Admission: AD | Admit: 2020-06-04 | Discharge: 2020-06-06 | DRG: 807 | Disposition: A | Discharge status: Home / Self Care | Payer: Medicaid Other | Attending: Obstetrics and Gynecology | Admitting: Obstetrics and Gynecology

## 2020-06-04 ENCOUNTER — Ambulatory Visit (INDEPENDENT_AMBULATORY_CARE_PROVIDER_SITE_OTHER): Payer: Self-pay | Admitting: Obstetrics and Gynecology

## 2020-06-04 VITALS — BP 110/70 | HR 80 | Wt 169.2 lb

## 2020-06-04 DIAGNOSIS — O480 Post-term pregnancy: Secondary | ICD-10-CM

## 2020-06-04 DIAGNOSIS — O0993 Supervision of high risk pregnancy, unspecified, third trimester: Secondary | ICD-10-CM

## 2020-06-04 DIAGNOSIS — Z20822 Contact with and (suspected) exposure to covid-19: Secondary | ICD-10-CM | POA: Diagnosis present

## 2020-06-04 DIAGNOSIS — Z789 Other specified health status: Secondary | ICD-10-CM

## 2020-06-04 DIAGNOSIS — O26893 Other specified pregnancy related conditions, third trimester: Secondary | ICD-10-CM | POA: Diagnosis present

## 2020-06-04 DIAGNOSIS — Z3A39 39 weeks gestation of pregnancy: Secondary | ICD-10-CM

## 2020-06-04 DIAGNOSIS — O0990 Supervision of high risk pregnancy, unspecified, unspecified trimester: Secondary | ICD-10-CM

## 2020-06-04 LAB — CBC
HCT: 34.4 % — ABNORMAL LOW (ref 36.0–46.0)
Hemoglobin: 11 g/dL — ABNORMAL LOW (ref 12.0–15.0)
MCH: 27.3 pg (ref 26.0–34.0)
MCHC: 32 g/dL (ref 30.0–36.0)
MCV: 85.4 fL (ref 80.0–100.0)
Platelets: 241 10*3/uL (ref 150–400)
RBC: 4.03 MIL/uL (ref 3.87–5.11)
RDW: 15 % (ref 11.5–15.5)
WBC: 13.8 10*3/uL — ABNORMAL HIGH (ref 4.0–10.5)
nRBC: 0 % (ref 0.0–0.2)

## 2020-06-04 LAB — RESPIRATORY PANEL BY RT PCR (FLU A&B, COVID)
Influenza A by PCR: NEGATIVE
Influenza B by PCR: NEGATIVE
SARS Coronavirus 2 by RT PCR: NEGATIVE

## 2020-06-04 LAB — TYPE AND SCREEN
ABO/RH(D): O POS
Antibody Screen: NEGATIVE

## 2020-06-04 MED ORDER — OXYCODONE-ACETAMINOPHEN 5-325 MG PO TABS: 1.0000 | ORAL_TABLET | ORAL | Status: DC | PRN

## 2020-06-04 MED ORDER — PRENATAL MULTIVITAMIN CH
1.0000 | ORAL_TABLET | Freq: Every day | ORAL | Status: DC
  Administered 2020-06-05 – 2020-06-06 (×2): 1 via ORAL
  Filled 2020-06-04 (×2): qty 1

## 2020-06-04 MED ORDER — ZOLPIDEM TARTRATE 5 MG PO TABS: 5.0000 mg | ORAL_TABLET | Freq: Every evening | ORAL | Status: DC | PRN

## 2020-06-04 MED ORDER — DIBUCAINE (PERIANAL) 1 % EX OINT: 1.0000 "application " | TOPICAL_OINTMENT | CUTANEOUS | Status: DC | PRN

## 2020-06-04 MED ORDER — ONDANSETRON HCL 4 MG/2ML IJ SOLN
4.0000 mg | Freq: Four times a day (QID) | INTRAMUSCULAR | Status: DC | PRN
Start: 2020-06-04 — End: 2020-06-04

## 2020-06-04 MED ORDER — ACETAMINOPHEN 325 MG PO TABS: 650.0000 mg | ORAL_TABLET | ORAL | Status: DC | PRN

## 2020-06-04 MED ORDER — LACTATED RINGERS IV SOLN: INTRAVENOUS | Status: DC

## 2020-06-04 MED ORDER — FENTANYL CITRATE (PF) 100 MCG/2ML IJ SOLN: 50.0000 ug | INTRAMUSCULAR | Status: DC | PRN

## 2020-06-04 MED ORDER — IBUPROFEN 600 MG PO TABS
600.0000 mg | ORAL_TABLET | Freq: Four times a day (QID) | ORAL | Status: DC
  Administered 2020-06-04 – 2020-06-06 (×7): 600 mg via ORAL
  Filled 2020-06-04 (×7): qty 1

## 2020-06-04 MED ORDER — WITCH HAZEL-GLYCERIN EX PADS: 1.0000 "application " | MEDICATED_PAD | CUTANEOUS | Status: DC | PRN

## 2020-06-04 MED ORDER — ONDANSETRON HCL 4 MG PO TABS: 4.0000 mg | ORAL_TABLET | ORAL | Status: DC | PRN

## 2020-06-04 MED ORDER — ONDANSETRON HCL 4 MG/2ML IJ SOLN: 4.0000 mg | INTRAMUSCULAR | Status: DC | PRN

## 2020-06-04 MED ORDER — OXYTOCIN-SODIUM CHLORIDE 30-0.9 UT/500ML-% IV SOLN
INTRAVENOUS | Status: AC
  Filled 2020-06-04: qty 500

## 2020-06-04 MED ORDER — SOD CITRATE-CITRIC ACID 500-334 MG/5ML PO SOLN: 30.0000 mL | ORAL | Status: DC | PRN

## 2020-06-04 MED ORDER — TETANUS-DIPHTH-ACELL PERTUSSIS 5-2.5-18.5 LF-MCG/0.5 IM SUSY: 0.5000 mL | PREFILLED_SYRINGE | Freq: Once | INTRAMUSCULAR | Status: DC

## 2020-06-04 MED ORDER — TERBUTALINE SULFATE 1 MG/ML IJ SOLN: 0.2500 mg | Freq: Once | INTRAMUSCULAR | Status: DC | PRN

## 2020-06-04 MED ORDER — SENNOSIDES-DOCUSATE SODIUM 8.6-50 MG PO TABS
2.0000 | ORAL_TABLET | ORAL | Status: DC
  Administered 2020-06-04 – 2020-06-06 (×2): 2 via ORAL
  Filled 2020-06-04 (×2): qty 2

## 2020-06-04 MED ORDER — MEASLES, MUMPS & RUBELLA VAC IJ SOLR: 0.5000 mL | Freq: Once | INTRAMUSCULAR | Status: DC

## 2020-06-04 MED ORDER — DIPHENHYDRAMINE HCL 25 MG PO CAPS: 25.0000 mg | ORAL_CAPSULE | Freq: Four times a day (QID) | ORAL | Status: DC | PRN

## 2020-06-04 MED ORDER — OXYCODONE HCL 5 MG PO TABS: 5.0000 mg | ORAL_TABLET | ORAL | Status: DC | PRN

## 2020-06-04 MED ORDER — SIMETHICONE 80 MG PO CHEW: 80.0000 mg | CHEWABLE_TABLET | ORAL | Status: DC | PRN

## 2020-06-04 MED ORDER — LACTATED RINGERS IV SOLN: 500.0000 mL | INTRAVENOUS | Status: DC | PRN

## 2020-06-04 MED ORDER — COCONUT OIL OIL: 1.0000 "application " | TOPICAL_OIL | Status: DC | PRN

## 2020-06-04 MED ORDER — MISOPROSTOL 25 MCG QUARTER TABLET: 25.0000 ug | ORAL_TABLET | ORAL | Status: DC | PRN

## 2020-06-04 MED ORDER — LIDOCAINE HCL (PF) 1 % IJ SOLN: 30.0000 mL | INTRAMUSCULAR | Status: DC | PRN

## 2020-06-04 MED ORDER — BENZOCAINE-MENTHOL 20-0.5 % EX AERO: 1.0000 "application " | INHALATION_SPRAY | CUTANEOUS | Status: DC | PRN

## 2020-06-04 MED ORDER — ERYTHROMYCIN 5 MG/GM OP OINT
TOPICAL_OINTMENT | OPHTHALMIC | Status: AC
  Filled 2020-06-04: qty 1

## 2020-06-04 MED ORDER — OXYTOCIN-SODIUM CHLORIDE 30-0.9 UT/500ML-% IV SOLN: 1.0000 m[IU]/min | INTRAVENOUS | Status: DC

## 2020-06-04 MED ORDER — OXYTOCIN-SODIUM CHLORIDE 30-0.9 UT/500ML-% IV SOLN: 2.5000 [IU]/h | INTRAVENOUS | Status: DC

## 2020-06-04 MED ORDER — OXYTOCIN BOLUS FROM INFUSION
333.0000 mL | Freq: Once | INTRAVENOUS | Status: AC
  Administered 2020-06-04: 1 mL via INTRAVENOUS

## 2020-06-04 NOTE — Discharge Instructions (Signed)
Parto vaginal, cuidados de puerperio Postpartum Care After Vaginal Delivery Lea esta informacin sobre cmo cuidarse desde el momento en que nazca su beb y hasta 6 a 12 semanas despus del parto (perodo del posparto). El mdico tambin podr darle instrucciones ms especficas. Comunquese con su mdico si tiene problemas o preguntas. Siga estas indicaciones en su casa: Hemorragia vaginal  Es normal tener un poco de hemorragia vaginal (loquios) despus del parto. Use un apsito sanitario para el sangrado vaginal y secrecin. ? Durante la primera semana despus del parto, la cantidad y el aspecto de los loquios a menudo es similar a las del perodo menstrual. ? Durante las siguientes semanas disminuir gradualmente hasta convertirse en una secrecin seca amarronada o amarillenta. ? En la mayora de las mujeres, los loquios se detienen completamente entre 4 a 6semanas despus del parto. Los sangrados vaginales pueden variar de mujer a mujer.  Cambie los apsitos sanitarios con frecuencia. Observe si hay cambios en el flujo, como: ? Un aumento repentino en el volumen. ? Cambio en el color. ? Cogulos sanguneos grandes.  Si expulsa un cogulo de sangre por la vagina, gurdelo y llame al mdico para informrselo. No deseche los cogulos de sangre por el inodoro antes de hablar con su mdico.  No use tampones ni se haga duchas vaginales hasta que el mdico la autorice.  Si no est amamantando, volver a tener su perodo entre 6 y 8 semanas despus del parto. Si solamente alimenta al beb con leche materna (lactancia materna exclusiva), podra no volver a tener su perodo hasta que deje de amamantar. Cuidados perineales  Mantenga la zona entre la vagina y el ano (perineo) limpia y seca, como se lo haya indicado el mdico. Utilice apsitos o aerosoles analgsicos y cremas, como se lo hayan indicado.  Si le hicieron un corte en el perineo (episiotoma) o tuvo un desgarro en la vagina, controle la  zona para detectar signos de infeccin hasta que sane. Est atenta a los siguientes signos: ? Aumento del enrojecimiento, la hinchazn o el dolor. ? Presenta lquido o sangre que supura del corte o desgarro. ? Calor. ? Pus o mal olor.  Es posible que le den una botella rociadora para que use en lugar de limpiarse el rea con papel higinico despus de usar el bao. Cuando comience a sanar, podr usar la botella rociadora antes de secarse. Asegrese de secarse suavemente.  Para aliviar el dolor causado por una episiotoma, un desgarro en la vagina o venas hinchadas en el ano (hemorroides), trate de tomar un bao de asiento tibio 2 o 3 veces por da. Un bao de asiento es un bao de agua tibia que se toma mientras se est sentado. El agua solo debe llegar hasta las caderas y cubrir las nalgas. Cuidado de las mamas  En los primeros das despus del parto, las mamas pueden sentirse pesadas, llenas e incmodas (congestin mamaria). Tambin puede escaparse leche de sus senos. El mdico puede sugerirle mtodos para aliviar este malestar. La congestin mamaria debera desaparecer al cabo de unos das.  Si est amamantando: ? Use un sostn que sujete y ajuste bien sus pechos. ? Mantenga los pezones secos y limpios. Aplquese cremas y ungentos, como se lo haya indicado el mdico. ? Es posible que deba usar discos de algodn en el sostn para absorber la leche que se filtre de sus senos. ? Puede tener contracciones uterinas cada vez que amamante durante varias semanas despus del parto. Las contracciones uterinas ayudan al tero a   regresar a su tamao habitual. ? Si tiene algn problema con la lactancia materna, colabore con el mdico o un asesor en lactancia.  Si no est amamantando: ? Evite tocarse mucho las mamas. Al hacerlo, podran producir ms leche. ? Use un sostn que le proporcione el ajuste correcto y compresas fras para reducir la hinchazn. ? No extraiga (saque) leche materna. Esto har que  produzca ms leche. Intimidad y sexualidad  Pregntele al mdico cundo puede retomar la actividad sexual. Esto puede depender de lo siguiente: ? Su riesgo de sufrir infecciones. ? La rapidez con la que est sanando. ? Su comodidad y deseo de retomar la actividad sexual.  Despus del parto, puede quedar embarazada incluso si no ha tenido todava su perodo. Si lo desea, hable con el mdico acerca de los mtodos de control de la natalidad (mtodos anticonceptivos). Medicamentos  Tome los medicamentos de venta libre y los recetados solamente como se lo haya indicado el mdico.  Si le recetaron un antibitico, tmelo como se lo haya indicado el mdico. No deje de tomar el antibitico aunque comience a sentirse mejor. Actividad  Retome sus actividades normales de a poco como se lo haya indicado el mdico. Pregntele al mdico qu actividades son seguras para usted.  Descanse todo lo que pueda. Trate de descansar o tomar una siesta mientras el beb duerme. Comida y bebida   Beba suficiente lquido como para mantener la orina de color amarillo plido.  Coma alimentos ricos en fibras todos los das. Estos pueden ayudarla a prevenir o aliviar el estreimiento. Los alimentos ricos en fibras incluyen, entre otros: ? Panes y cereales integrales. ? Arroz integral. ? Frijoles. ? Frutas y verduras frescas.  No intente perder de peso rpidamente reduciendo el consumo de caloras.  Tome sus vitaminas prenatales hasta la visita de seguimiento de posparto o hasta que su mdico le indique que puede dejar de tomarlas. Estilo de vida  No consuma ningn producto que contenga nicotina o tabaco, como cigarrillos y cigarrillos electrnicos. Si necesita ayuda para dejar de fumar, consulte al mdico.  No beba alcohol, especialmente si est amamantando. Instrucciones generales  Concurra a todas las visitas de seguimiento para usted y el beb, como se lo haya indicado el mdico. La mayora de las mujeres  visita al mdico para un seguimiento de posparto dentro de las primeras 3 a 6 semanas despus del parto. Comunquese con un mdico si:  Se siente incapaz de controlar los cambios que implica tener un hijo y esos sentimientos no desaparecen.  Siente tristeza o preocupacin de forma inusual.  Las mamas se ponen rojas, le duelen o se endurecen.  Tiene fiebre.  Tiene dificultad para retener la orina o para impedir que la orina se escape.  Tiene poco inters o falta de inters en actividades que solan gustarle.  No ha amamantado nada y no ha tenido un perodo menstrual durante 12 semanas despus del parto.  Dej de amamantar al beb y no ha tenido su perodo menstrual durante 12 semanas despus de dejar de amamantar.  Tiene preguntas sobre su cuidado y el del beb.  Elimina un cogulo de sangre grande por la vagina. Solicite ayuda de inmediato si:  Siente dolor en el pecho.  Tiene dificultad para respirar.  Tiene un dolor repentino e intenso en la pierna.  Tiene dolor intenso o clicos en el la parte inferior del abdomen.  Tiene una hemorragia tan intensa de la vagina que empapa ms de un apsito en una hora. El sangrado   no debe ser ms abundante que el perodo ms intenso que haya tenido.  Dolor de cabeza intenso.  Se desmaya.  Tiene visin borrosa o manchas en la vista.  Tiene secrecin vaginal con mal olor.  Tiene pensamientos acerca de lastimarse a usted misma o a su beb. Si alguna vez siente que puede lastimarse a usted misma o a otras personas, o tiene pensamientos de poner fin a su vida, busque ayuda de inmediato. Puede dirigirse al departamento de emergencias ms cercano o llamar a:  El servicio de emergencias de su localidad (911 en [place]  Una lnea de asistencia al suicida y atencin en crisis, como la Lnea Nacional de Prevencin del Suicidio (National Suicide Prevention Lifeline), al 1-800-273-8255. Est disponible las 24 horas del da. Resumen  El  perodo de tiempo justo despus el parto y hasta 6 a 12 semanas despus del parto se denomina perodo posparto.  Retome sus actividades normales de a poco como se lo haya indicado el mdico.  Concurra a todas las visitas de seguimiento para usted y el beb, como se lo haya indicado el mdico. Esta informacin no tiene como fin reemplazar el consejo del mdico. Asegrese de hacerle al mdico cualquier pregunta que tenga. Document Revised: 10/15/2017 Document Reviewed: 06/26/2017 Elsevier Patient Education  2020 Elsevier Inc.  

## 2020-06-04 NOTE — Progress Notes (Signed)
   PRENATAL VISIT NOTE  Subjective:  Sara Price is a 29 y.o. G3P1011 at [redacted]w[redacted]d being seen today for ongoing prenatal care.  She is currently monitored for the following issues for this low-risk pregnancy and has Language barrier; Supervision of high risk pregnancy, antepartum; [redacted] weeks gestation of pregnancy; and [redacted] weeks gestation of pregnancy on their problem list.  Patient doing well with no acute concerns today. She reports no complaints.  Contractions: Not present. Vag. Bleeding: None.  Movement: Present. Denies leaking of fluid.   Discussed NST/BPP on 11/22 for postdates and IOL on 11/26.    The following portions of the patient's history were reviewed and updated as appropriate: allergies, current medications, past family history, past medical history, past social history, past surgical history and problem list. Problem list updated.  Objective:   Vitals:   06/04/20 0919  BP: 110/70  Pulse: 80  Weight: 169 lb 3.2 oz (76.7 kg)    Fetal Status: Fetal Heart Rate (bpm): 144 Fundal Height: 39 cm Movement: Present  Presentation: Vertex  General:  Alert, oriented and cooperative. Patient is in no acute distress.  Skin: Skin is warm and dry. No rash noted.   Cardiovascular: Normal heart rate noted  Respiratory: Normal respiratory effort, no problems with respiration noted  Abdomen: Soft, gravid, appropriate for gestational age.  Pain/Pressure: Absent     Pelvic: Cervical exam deferred        Extremities: Normal range of motion.  Edema: None  Mental Status:  Normal mood and affect. Normal behavior. Normal judgment and thought content.   Assessment and Plan:  Pregnancy: G3P1011 at [redacted]w[redacted]d  1. Supervision of high risk pregnancy, antepartum IOL scheduled  2. Post term pregnancy over 40 weeks NST/BPP scheduled for 11/22 - [place] MFM FETAL BPP W/NONSTRESS; Future  3. Language barrier Interpreter present  4. [redacted] weeks gestation of pregnancy   Term labor symptoms and general  obstetric precautions including but not limited to vaginal bleeding, contractions, leaking of fluid and fetal movement were reviewed in detail with the patient.  Please refer to After Visit Summary for other counseling recommendations.   No follow-ups on file.   Mariel Aloe, MD

## 2020-06-04 NOTE — Discharge Summary (Addendum)
Postpartum Discharge Summary     Patient Name: Sara Price DOB: 10/10/1990 MRN: [id number]  Date of admission: 06/04/2020 Delivery date:06/04/2020  Delivering provider: Gerlene Fee  Date of discharge: 06/06/2020  Admitting diagnosis: Post term pregnancy, 41 weeks [O48.0, Z3A.41] Intrauterine pregnancy: [redacted]w[redacted]d    Secondary diagnosis:  Active Problems:   Language barrier   Labor and delivery indication for care or intervention   SVD (spontaneous vaginal delivery)   [redacted] weeks gestation of pregnancy  Additional problems: none    Discharge diagnosis: Term Pregnancy Delivered                                              Post partum procedures: none Augmentation:  none Complications: None  Hospital course: Onset of Labor With Vaginal Delivery      29y.o. yo G3P1011 at 36w5das admitted in Active Labor on 06/04/2020. Patient had an uncomplicated labor course, delivering within minutes of arrival to L&D, as follows:  Membrane Rupture Time/Date: 8:50 PM ,06/04/2020   Delivery Method:Vaginal, Spontaneous  Episiotomy: None  Lacerations:  None  Patient had an uncomplicated postpartum course.  She is ambulating, tolerating a regular diet, passing flatus, and urinating well. Patient is discharged home in stable condition on 06/06/20.  Newborn Data: Birth date:06/04/2020  Birth time:8:50 PM  Gender:Female  Living status:Living  Apgars:9 ,9  Weight:2920 g (6lb 7oz)  Magnesium Sulfate received: No BMZ received: No Rhophylac:N/A MMR:N/A T-DaP:Given prenatally (June, too early) Flu: Yes Transfusion:No  Physical exam  Vitals:   06/05/20 0905 06/05/20 1251 06/05/20 2100 06/06/20 0615  BP: (!) 102/51 102/62 94/66 100/64  Pulse: (!) 57 62 60 (!) 55  Resp: 16 15 17 16   Temp: 98 F (36.7 C) 98.3 F (36.8 C) 98.3 F (36.8 C) 97.7 F (36.5 C)  TempSrc: Oral Tympanic Oral Oral  SpO2: 99% 99% 98% 98%  Weight:      Height:       General: alert, cooperative and no  distress Lochia: appropriate Uterine Fundus: firm Incision: n/a DVT Evaluation: No evidence of DVT seen on physical exam. Labs: Lab Results  Component Value Date   WBC 13.8 (H) 06/04/2020   HGB 11.0 (L) 06/04/2020   HCT 34.4 (L) 06/04/2020   MCV 85.4 06/04/2020   PLT 241 06/04/2020   No flowsheet data found. Edinburgh Score: Edinburgh Postnatal Depression Scale Screening Tool 05/16/2017  I have been able to laugh and see the funny side of things. 0  I have looked forward with enjoyment to things. 0  I have blamed myself unnecessarily when things went wrong. 0  I have been anxious or worried for no good reason. 0  I have felt scared or panicky for no good reason. 0  Things have been getting on top of me. 0  I have been so unhappy that I have had difficulty sleeping. 0  I have felt sad or miserable. 0  I have been so unhappy that I have been crying. 0  The thought of harming myself has occurred to me. 0  Edinburgh Postnatal Depression Scale Total 0   After visit meds:  Allergies as of 06/06/2020   No Known Allergies     Medication List    STOP taking these medications   famotidine 40 MG tablet Commonly known as: Pepcid     TAKE these medications  acetaminophen 325 MG tablet Commonly known as: Tylenol Take 2 tablets (650 mg total) by mouth every 4 (four) hours as needed (for pain scale < 4).   ibuprofen 600 MG tablet Commonly known as: ADVIL Take 1 tablet (600 mg total) by mouth every 6 (six) hours.   Prenatal Vitamins 28-0.8 MG Tabs Take 1 tablet by mouth daily.      Discharge home in stable condition Infant Feeding: Breast Infant Disposition:home with mother Discharge instruction: per After Visit Summary and Postpartum booklet. Activity: Advance as tolerated. Pelvic rest for 6 weeks.  Diet: routine diet Future Appointments: Future Appointments  Date Time Provider [place]  06/11/2020  9:30 AM [facility]ENING [facility] None   Follow up Visit: Pt  instructed to call [facility] to schedule PP appt.  Follow-up Information    Department, [facility]. Schedule an appointment as soon as possible for a visit in 4 week(s).   Why: for your postpartum appointment and Nexplanon placement Contact information: [place] [address] [phone]               Randa Ngo, MD OB Fellow, Faculty Practice 06/06/2020 7:54 AM

## 2020-06-04 NOTE — MAU Note (Signed)
Patient rolled directly into MAU room after registration called about close contractions. Patient reported SROM clear fluid 15 minutes prior to arrival and "very close" contractions that began today.  SVE 10/100/0 Vertex FHR obtained 140 Provider called to bedside.  Labor and delivery called for room.  Covid swab obtained without difficulty.  Patient transported to labor and delivery accompanied by R. Arita Miss, CNM

## 2020-06-04 NOTE — H&P (Signed)
Sara Price is a 29 y.o. female G35P1011 @ 39.5wks presenting for reg ctx today with leaking fluid since 2030. Denies bldg, H/A, N/V or visual disturbances. Her preg has been followed by the [facility] service and has been essentially unremarkable.  OB History    Gravida  3   Para  1   Term  1   Preterm      AB  1   Living  1     SAB  1   TAB      Ectopic      Multiple  0   Live Births  1          Past Medical History:  Diagnosis Date  . Medical history non-contributory    Past Surgical History:  Procedure Laterality Date  . NO PAST SURGERIES     Family History: family history includes Diabetes in her father. Social History:  reports that she has never smoked. She has never used smokeless tobacco. She reports that she does not drink alcohol and does not use drugs.     Maternal Diabetes: No Genetic Screening: Declined Maternal Ultrasounds/Referrals: Normal Fetal Ultrasounds or other Referrals:  None Maternal Substance Abuse:  No Significant Maternal Medications:  None Significant Maternal Lab Results:  Group B Strep negative Other Comments:  None  Review of Systems History   Last menstrual period 08/31/2019, unknown if currently breastfeeding. Maternal Exam:  Introitus: Normal vulva.   Physical Exam Constitutional:      Appearance: Normal appearance.  HENT:     Head: Normocephalic.     Nose: Nose normal.     Mouth/Throat:     Mouth: Mucous membranes are moist.  Cardiovascular:     Rate and Rhythm: Normal rate.  Pulmonary:     Effort: Pulmonary effort is normal.  Abdominal:     Comments: EFM 130s, +accels, occ variables (on monitor <78mins) Ctx q 2-3 mins  Genitourinary:    General: Normal vulva.  Musculoskeletal:        General: Normal range of motion.     Cervical back: Normal range of motion.  Skin:    General: Skin is warm and dry.  Neurological:     General: No focal deficit present.     Mental Status: She is alert.   Psychiatric:        Mood and Affect: Mood normal.        Thought Content: Thought content normal.     Prenatal labs: ABO, Rh: O/Positive/-- (07/20 1429) Antibody: Negative (07/20 1429) Rubella: 10.80 (07/20 1429) RPR: Non Reactive (09/10 0839)  HBsAg: Negative (07/20 1429)  HIV: Non Reactive (09/10 0839)  GBS: Negative/-- (10/29 1023)   Assessment/Plan: IUP@term  Active labor/transition GBS neg  Admit to Labor and Delivery Expectant management Anticipate vag del   Arabella Merles CNM 06/04/2020, 8:45 PM

## 2020-06-04 NOTE — Patient Instructions (Signed)
Induccin del trabajo de parto Labor Induction  Se denomina induccin del trabajo de parto cuando se inician acciones para hacer que una mujer embarazada comience el trabajo de parto. La mayora de las mujeres comienzan el trabajo de parto de forma natural entre las semanas 37 y 42 del embarazo. Cuando esto no ocurre o cuando por necesidad mdica se debe iniciarlo por otros medios, se podran utilizar diferentes mtodos. La induccin del trabajo de parto hace que el tero se contraiga. Tambin hace que el cuello uterino se ablande (madure), se abra (se dilate), y se afine (se borre). Generalmente, el trabajo de parto no se induce antes de las 39semanas, excepto que haya un motivo mdico para hacerlo. El mdico determinar si se debe inducir el parto. Antes de inducir el trabajo de parto, el mdico considerar ciertos factores; entre ellos:  Su cuadro clnico y el del beb.  En cul semana del embarazo se encuentra.  La madurez de los pulmones del beb.  El estado del cuello uterino.  La posicin del beb.  El tamao del canal del parto. Cules son algunos de los motivos para inducir un parto? Se podra inducir un parto en los siguientes casos:  Su salud o la salud del beb estn en riesgo.  El embarazo se pas de trmino 1semana o ms.  Rompi la bolsa, pero no se inici el trabajo de parto de forma natural.  La cantidad de lquido amnitico que rodea al beb es poca. Tambin podra optar por (elegir) que le induzcan el trabajo de parto en un determinado momento. Por lo general, la induccin del trabajo de parto por eleccin no se hace antes de las 39semanas del embarazo. Qu mtodos se usan para inducir el trabajo de parto? Los mtodos utilizados para inducir el trabajo de parto incluyen los siguientes:  Administracin de prostaglandina. Este medicamento hace que se inicien las contracciones y que el cuello uterino se dilate y aliste. Puede tomarse por boca (de forma oral) o  introducirse en la vagina (supositorio).  Insercin de un pequeo tubo delgado (catter) que tiene un baln en el extremo en la vagina; luego, expansin del baln con agua para dilatar el cuello uterino.  Ruptura de las membranas. En este mtodo, el mdico separa, con cuidado, el tejido del saco amnitico del cuello uterino. En consecuencia, el cuello uterino se expande, lo que, a la vez, provoca la liberacin de una hormona llamada progesterona. Esta hormona hace que el tero se contraiga. Este procedimiento suele realizarse en el consultorio del mdico; luego, la enviarn a su hogar para esperar a que comiencen las contracciones.  Romper la bolsa de las aguas. En este mtodo, el mdico usa un pequeo instrumento para hacer un pequeo orificio en el saco amnitico. Al cabo de un tiempo, esto har que el saco amnitico se rompa. Las contracciones deberan comenzar algunas horas despus.  Medicamentos que desencadenen o intensifiquen las contracciones. Estos se administran a travs de una va intravenosa (IV) que se coloca en una vena del brazo. Excepto la ruptura de las membranas, que puede realizarse en una clnica, la induccin del trabajo de parto se realiza en el hospital para que puedan controlarlos con atencin a usted y al beb. Cunto tiempo lleva inducir el trabajo de parto? La duracin del proceso de induccin depende de la preparacin del cuerpo para el trabajo de parto. Algunas inducciones pueden durar hasta 2 o 3das, mientras que otras podran durar menos de un da. La induccin podra durar ms en los   siguientes casos:  La induccin se hace en una etapa temprana del embarazo.  Es el primer embarazo de la madre.  El cuello uterino no est listo. Cules son algunos de los riesgos asociados con la induccin del trabajo de parto? Algunos de los riesgos asociados con la induccin del trabajo de parto son los siguientes:  Cambios en la frecuencia cardaca fetal, por ejemplo, los  latidos son demasiado rpidos o lentos, o son irregulares (errticos).  Fracaso de la induccin.  Infeccin en la madre o el beb.  Aumento de la posibilidad de que sea necesaria una cesrea.  Muerte fetal.  Ruptura (desprendimiento) de la placenta del tero (raro).  Ruptura del tero (muy poco frecuente). Cuando es necesario realizar una induccin por motivos mdicos, los beneficios suelen superar los riesgos. Cules son algunos de los motivos para no inducir el trabajo de parto? La induccin no debe realizarse si:  El beb no tolera las contracciones.  Se someti anteriormente a cirugas en el tero, como una miomectoma, le extirparon fibromas o tiene una cicatriz vertical de un parto por cesrea anterior.  La placenta est en una posicin muy baja en el tero y obstruye la abertura del cuello uterino (placenta previa).  El beb no est ubicado con la cabeza hacia bajo.  El cordn umbilical cae hacia el canal del parto, adelante del beb.  Hay circunstancias poco habituales, como que se encuentre en una etapa muy temprana del embarazo (beb prematuro).  Tuvo ms de 2partos por cesrea anteriormente. Resumen  Se denomina induccin del trabajo de parto cuando se inician acciones para hacer que una mujer embarazada comience el trabajo de parto.  La induccin del trabajo de parto hace que el tero se contraiga. Tambin hace que el cuello uterino se aliste, se dilate y se borre.  El trabajo de parto no se induce antes de las 39semanas de gestacin, excepto que haya un motivo mdico para hacerlo.  Cuando es necesario realizar una induccin por motivos mdicos, los beneficios suelen superar los riesgos. Esta informacin no tiene como fin reemplazar el consejo del mdico. Asegrese de hacerle al mdico cualquier pregunta que tenga. Document Revised: 03/15/2017 Document Reviewed: 03/15/2017 Elsevier Patient Education  2020 Elsevier Inc.   

## 2020-06-05 LAB — RPR: RPR Ser Ql: NONREACTIVE

## 2020-06-05 NOTE — Lactation Note (Signed)
This note was copied from a baby's chart. Lactation Consultation Note  Patient Name: Sara Price Today's Date: 06/05/2020 Reason for consult: Initial assessment  Consult was done in Spanish.   Initial visit to 20 hours old of a P2 mother with some breastfeeding experience. Mother reports difficulty with latch with her first child and only breastfed for 2 months.  Infant is sleepy in basinet upon arrival. Mother states infant latched very well after delivery and S2S for ~1h. Mother expresses her concern because infant is sucking her hands a lot. Reviewed hunger cues and cluster-feeding. Talked to mother about hand expression and demonstrated technique. Reinforced using her EBM to supplement infant. Mother denies discomfort or pain with latch.    Plan: 1-Breastfeeding on demand, ensuring a deep, comfortable latch.  2-Offer breast 8-12 times in 24h period to establish good milk supply. 3-Undressing infant and place skin to skin when ready to breastfeed 4-Keep infant awake during breastfeeding session: massaging breast, infant's hand/shoulder/feet 5-Monitor voids and stools as signs good intake.  6-Encouraged maternal rest, hydration and food intake.  7-Contact LC as needed for feeds/support/concerns/questions   All questions answered at this time. Provided Lactation services brochure.    Maternal Data Formula Feeding for Exclusion: No Has patient been taught Hand Expression?: Yes Does the patient have breastfeeding experience prior to this delivery?: Yes  Feeding Feeding Type: Breast Fed  Interventions Interventions: Breast feeding basics reviewed;Hand express;Breast massage  Lactation Tools Discussed/Used WIC Program: No (planning to apply)   Consult Status Consult Status: Follow-up Date: 06/06/20 Follow-up type: In-patient    Patrisia Faeth A Higuera Ancidey 06/05/2020, 4:52 PM

## 2020-06-05 NOTE — Progress Notes (Signed)
POSTPARTUM PROGRESS NOTE  Post Partum Day 1  Subjective:  Sara Price is a 29 y.o. [id number] s/p VD at [redacted]w[redacted]d. Sleeping comfortably. Partner reports she is doing well. No acute events overnight. She denies any problems with ambulating, voiding or po intake. Denies nausea or vomiting.  Pain is well controlled.  Lochia is decreasing.  Objective: Blood pressure 107/62, pulse (!) 58, temperature (!) 97.4 F (36.3 C), temperature source Oral, resp. rate 18, height 5\' 2"  (1.575 m), weight 76.7 kg, last menstrual period 08/31/2019, SpO2 100 %, unknown if currently breastfeeding.  Physical Exam:  General: alert, cooperative and no distress Chest: no respiratory distress Extremities: No LE edema Skin: warm, dry  Recent Labs    06/04/20 2109  HGB 11.0*  HCT 34.4*    Assessment/Plan: Sara Price is a 29 y.o. 26 s/p VD at [redacted]w[redacted]d   PPD#1 - Doing well  Routine postpartum care Contraception: Outpt Nexplanon Feeding: Breast Dispo: Plan for discharge home tomorrow.   LOS: 1 day   [redacted]w[redacted]d, DO 06/05/2020, 4:51 AM PGY-2, [facility]

## 2020-06-05 NOTE — Progress Notes (Signed)
POSTPARTUM PROGRESS NOTE  Subjective: Sara Price is a 29 y.o. [id number] s/p vaginal delivery at [redacted]w[redacted]d.  She reports she doing well. No acute events overnight. She denies any problems with ambulating, voiding or po intake. Denies nausea or vomiting. She has passed flatus. Pain is well controlled.  Lochia is minimal.  Objective: Blood pressure (!) 97/58, pulse (!) 54, temperature 98.2 F (36.8 C), temperature source Oral, resp. rate 18, height 5\' 2"  (1.575 m), weight 76.7 kg, last menstrual period 08/31/2019, SpO2 100 %, unknown if currently breastfeeding.  Physical Exam:  General: alert, cooperative and no distress Chest: no respiratory distress Abdomen: soft, non-tender  Uterine Fundus: firm and at level of umbilicus Extremities: No calf swelling or tenderness  no LE edema  Recent Labs    06/04/20 2109  HGB 11.0*  HCT 34.4*    Assessment/Plan: Sara Price is a 29 y.o. 26 s/p vaginal delivery at [redacted]w[redacted]d.  Routine Postpartum Care: Doing well, pain well-controlled.  -- Continue routine care, lactation support  -- Contraception: plan for outpatient Nexplanon at [facility] -- Feeding: breast.  Dispo: Plan for discharge PPD#2.  [redacted]w[redacted]d, MD OB Fellow, Faculty Practice 06/05/2020 7:11 AM

## 2020-06-06 MED ORDER — IBUPROFEN 600 MG PO TABS
600.0000 mg | ORAL_TABLET | Freq: Four times a day (QID) | ORAL | 0 refills | Status: DC
Start: 2020-06-06 — End: 2022-09-28

## 2020-06-06 NOTE — Lactation Note (Signed)
This note was copied from a baby's chart. Lactation Consultation Note  Patient Name: Sara Price [id number] Date: 06/06/2020 Reason for consult: Follow-up assessment;Term Mom sitting in chair holding sleeping baby, dad resting on couch. Per mom's request dad served as Research officer, [employer]. Mom reports mild discomfort to right nipple when breastfeeding, states baby cluster fed all night, last feeding was ~11am. Mom states otherwise feedings are going well, plans to breastfeed for 3-6 months, has a pump at home. Mom latched baby to left breast, baby observed nursing on nipple only, pillow placed under baby to bring to level of breast, mom sandwiched breast and achieved deeper latch. Multiple audible swallows noted, mom denies discomfort. Discussed positioning for breastfeeding vs nipple nursing, skin to skin, engorgement and how to manage, milk storage guidelines, feeding frequency, [facility] BF brochure with telephone numbers for LC support. Mom reports with first baby noting small amount of blood in expressed milk collected with pump, advised milk is safe to give to baby, check pump settings and nipples for damage, if notes with this baby f/u with LC. Mom voiced understanding and with no further concerns. Left the room with baby still nursing ~51min mark.  Plan - feed on cue, wake if >3hrs since last feeding - keep baby close during feedings (nose to chin) to prevent nipple nursing - warm compress and nurse often with engorgement, pump if needed for comfort - f/u with LC if with new/ worsening feeding concerns  Maternal Data    Feeding Feeding Type: Breast Fed  LATCH Score Latch: Grasps breast easily, tongue down, lips flanged, rhythmical sucking.  Audible Swallowing: Spontaneous and intermittent  Type of Nipple: Everted at rest and after stimulation  Comfort (Breast/Nipple): Filling, red/small blisters or bruises, mild/mod discomfort  Hold (Positioning): Assistance needed to  correctly position infant at breast and maintain latch.  LATCH Score: 8  Interventions Interventions: Breast feeding basics reviewed;Assisted with latch;Support pillows  Lactation Tools Discussed/Used     Consult Status Consult Status: Complete    Charlynn Court 06/06/2020, 1:45 PM

## 2020-06-09 ENCOUNTER — Other Ambulatory Visit: Payer: Self-pay

## 2020-06-11 ENCOUNTER — Other Ambulatory Visit (HOSPITAL_COMMUNITY): Payer: Self-pay

## 2020-06-13 ENCOUNTER — Inpatient Hospital Stay (HOSPITAL_COMMUNITY): Admission: AD | Admit: 2020-06-13 | Payer: Self-pay | Source: Home / Self Care | Admitting: Obstetrics and Gynecology

## 2020-06-13 ENCOUNTER — Inpatient Hospital Stay (HOSPITAL_COMMUNITY): Payer: Self-pay

## 2022-07-19 NOTE — L&D Delivery Note (Signed)
OB/GYN Faculty Practice Delivery Note  Sara Price is a 32 y.o. [id number] s/p VD at [redacted]w[redacted]d She was admitted for IOL.   ROM: 0h 344mith clear fluid GBS Status:  Positive/-- (02/22 0251) Maximum Maternal Temperature: 97.9  Labor Progress: Initial SVE: 0.5/thick/ballotable. She then progressed to complete.   Delivery Date/Time: 09/26/2022 '@1115'$  Delivery: Called to room and patient was complete and pushing. Head delivered LOA. No nuchal cord present. Shoulder and body delivered in usual fashion. Infant with spontaneous cry, placed on mother's abdomen, dried and stimulated. Cord clamped x 2 after 1-minute delay, and cut by FOB. Cord blood drawn. Placenta delivered spontaneously with gentle cord traction. Fundus firm with massage and Pitocin. Labia, perineum, vagina, and cervix inspected with good hemostasis and no laceration.  Baby Weight: pending  Placenta: 3 vessel, intact. Sent to L&D Complications: None Lacerations: None.  EBL: 64 mL Analgesia: Maternally supported   Infant:  APGAR (1 MIN): 8   APGAR (5 MINS): 9 [place] OB Fellow, FaHammond[place] [employer]/04/2023, 12:21 PM

## 2022-08-13 ENCOUNTER — Ambulatory Visit (INDEPENDENT_AMBULATORY_CARE_PROVIDER_SITE_OTHER): Payer: Self-pay | Admitting: Family Medicine

## 2022-08-13 ENCOUNTER — Other Ambulatory Visit (HOSPITAL_COMMUNITY)
Admission: RE | Admit: 2022-08-13 | Discharge: 2022-08-13 | Disposition: A | Discharge status: Home / Self Care | Payer: Self-pay | Source: Ambulatory Visit | Attending: Family Medicine | Admitting: Family Medicine

## 2022-08-13 ENCOUNTER — Encounter: Payer: Self-pay | Admitting: Family Medicine

## 2022-08-13 VITALS — BP 122/72 | HR 80 | Wt 163.0 lb

## 2022-08-13 DIAGNOSIS — O0990 Supervision of high risk pregnancy, unspecified, unspecified trimester: Secondary | ICD-10-CM | POA: Insufficient documentation

## 2022-08-13 DIAGNOSIS — Z3A32 32 weeks gestation of pregnancy: Secondary | ICD-10-CM

## 2022-08-13 DIAGNOSIS — O0933 Supervision of pregnancy with insufficient antenatal care, third trimester: Secondary | ICD-10-CM

## 2022-08-13 NOTE — Progress Notes (Signed)
NOB [redacted]w[redacted]d  LMP: 12/27/2021  EDD 10/03/22  Flu Vaccine: No Genetic Screening:  Declined   *Pt does not know Gender.  Has not had OB care anywhere.    Last pap: 2022 WNL per pt  per chart 2021 WNL   CC: None

## 2022-08-13 NOTE — Progress Notes (Addendum)
   INITIAL PRENATAL VISIT NOTE  Subjective:  Sara Price is a 32 y.o. [id number] at [redacted]w[redacted]d being seen today for ongoing prenatal care.  She is currently monitored for the following issues for this high-risk pregnancy and has Language barrier; Supervision of high risk pregnancy, antepartum; and Late prenatal care affecting pregnancy in third trimester on their problem list.  Patient reports no complaints.  Contractions: Not present. Vag. Bleeding: None.  Movement: Present. Denies leaking of fluid.   The following portions of the patient's history were reviewed and updated as appropriate: allergies, current medications, past family history, past medical history, past social history, past surgical history and problem list.   Objective:   Vitals:   08/13/22 1016  BP: 122/72  Pulse: 80  Weight: 163 lb (73.9 kg)    Fetal Status: Fetal Heart Rate (bpm): 147 Fundal Height: 32 cm Movement: Present     General:  Alert, oriented and cooperative. Patient is in no acute distress.  Skin: Skin is warm and dry. No rash noted.   Cardiovascular: Normal heart rate noted  Respiratory: Normal respiratory effort, no problems with respiration noted  Abdomen: Soft, gravid, appropriate for gestational age.  Pain/Pressure: Absent     Pelvic: Cervical exam deferred        Extremities: Normal range of motion.  Edema: None  Mental Status: Normal mood and affect. Normal behavior. Normal judgment and thought content.   Assessment and Plan:  Pregnancy: [id number] at [redacted]w[redacted]d 1. Supervision of high risk pregnancy, antepartum New OB, initial entry to care Provided letters today for: 1) Tdap/Flu 2)work release 3/11  3)Dental clearance Reviewed need for 2h GTT ASAP Needs [place] Pap is up to date, [facility] Last [place] pap was in 2021 and was NIL. Recommend pap pp at [place], OB Urine - CBC/D/Plt+RPR+Rh+ABO+RubIgG... - Urine cytology ancillary only - Hemoglobin  A1c  2. Late prenatal care affecting pregnancy in third trimester @32w   Preterm labor symptoms and general obstetric precautions including but not limited to vaginal bleeding, contractions, leaking of fluid and fetal movement were reviewed in detail with the patient. Please refer to After Visit Summary for other counseling recommendations.   Return in about 2 weeks (around 08/27/2022) for Routine prenatal care.  Future Appointments  Date Time Provider [place]  08/26/2022  8:35 AM Darlina Rumpf, [place] [facility]  09/09/2022  8:35 AM Darlina Rumpf, CNM [facility]  09/16/2022  8:35 AM Aletha Halim, MD [facility]  09/23/2022  8:35 AM Darlina Rumpf, CNM [facility]  09/30/2022  8:55 AM Donnamae Jude, MD [facility]    Caren Macadam, MD

## 2022-08-14 LAB — CBC/D/PLT+RPR+RH+ABO+RUBIGG...
Antibody Screen: NEGATIVE
Basophils Absolute: 0 10*3/uL (ref 0.0–0.2)
Basos: 0 %
EOS (ABSOLUTE): 0.1 10*3/uL (ref 0.0–0.4)
Eos: 1 %
HCV Ab: NONREACTIVE
HIV Screen 4th Generation wRfx: NONREACTIVE
Hematocrit: 30.3 % — ABNORMAL LOW (ref 34.0–46.6)
Hemoglobin: 10.3 g/dL — ABNORMAL LOW (ref 11.1–15.9)
Hepatitis B Surface Ag: NEGATIVE
Lymphocytes Absolute: 1.1 10*3/uL (ref 0.7–3.1)
Lymphs: 14 %
MCH: 29.4 pg (ref 26.6–33.0)
MCHC: 34 g/dL (ref 31.5–35.7)
MCV: 87 fL (ref 79–97)
Monocytes Absolute: 0.2 10*3/uL (ref 0.1–0.9)
Monocytes: 3 %
Neutrophils Absolute: 6.2 10*3/uL (ref 1.4–7.0)
Neutrophils: 79 %
Platelets: 208 10*3/uL (ref 150–450)
RBC: 3.5 x10E6/uL — ABNORMAL LOW (ref 3.77–5.28)
RDW: 14.1 % (ref 11.7–15.4)
RPR Ser Ql: NONREACTIVE
Rh Factor: POSITIVE
Rubella Antibodies, IGG: 8.7 index (ref >0.99)
WBC: 7.9 10*3/uL (ref 3.4–10.8)

## 2022-08-14 LAB — HCV INTERPRETATION

## 2022-08-14 LAB — HEMOGLOBIN A1C
Est. average glucose Bld gHb Est-mCnc: 114 mg/dL
Hgb A1c MFr Bld: 5.6 % (ref 4.8–5.6)

## 2022-08-14 LAB — IMMATURE CELLS: Metamyelocytes: 3 % — ABNORMAL HIGH (ref 0–0)

## 2022-08-15 LAB — URINE CULTURE, OB REFLEX

## 2022-08-15 LAB — CULTURE, OB URINE

## 2022-08-16 ENCOUNTER — Other Ambulatory Visit: Payer: Self-pay

## 2022-08-16 ENCOUNTER — Encounter: Payer: Self-pay | Admitting: Family Medicine

## 2022-08-16 DIAGNOSIS — O0990 Supervision of high risk pregnancy, unspecified, unspecified trimester: Secondary | ICD-10-CM

## 2022-08-16 DIAGNOSIS — R7989 Other specified abnormal findings of blood chemistry: Secondary | ICD-10-CM | POA: Insufficient documentation

## 2022-08-16 LAB — URINE CYTOLOGY ANCILLARY ONLY
Chlamydia: NEGATIVE
Comment: NEGATIVE
Comment: NORMAL
Neisseria Gonorrhea: NEGATIVE

## 2022-08-16 MED ORDER — FERROUS SULFATE 325 (65 FE) MG PO TABS: 325.0000 mg | ORAL_TABLET | ORAL | 1 refills | Status: DC

## 2022-08-17 LAB — GLUCOSE TOLERANCE, 2 HOURS W/ 1HR
Glucose, 1 hour: 172 mg/dL (ref 70–179)
Glucose, 2 hour: 134 mg/dL (ref 70–152)
Glucose, Fasting: 76 mg/dL (ref 70–91)

## 2022-08-26 ENCOUNTER — Ambulatory Visit (INDEPENDENT_AMBULATORY_CARE_PROVIDER_SITE_OTHER): Payer: Self-pay | Admitting: Advanced Practice Midwife

## 2022-08-26 ENCOUNTER — Encounter: Payer: Self-pay | Admitting: Advanced Practice Midwife

## 2022-08-26 VITALS — BP 119/67 | HR 71 | Wt 166.4 lb

## 2022-08-26 DIAGNOSIS — O0993 Supervision of high risk pregnancy, unspecified, third trimester: Secondary | ICD-10-CM

## 2022-08-26 DIAGNOSIS — Z789 Other specified health status: Secondary | ICD-10-CM

## 2022-08-26 DIAGNOSIS — O0933 Supervision of pregnancy with insufficient antenatal care, third trimester: Secondary | ICD-10-CM

## 2022-08-26 DIAGNOSIS — O0990 Supervision of high risk pregnancy, unspecified, unspecified trimester: Secondary | ICD-10-CM

## 2022-08-26 DIAGNOSIS — Z3A34 34 weeks gestation of pregnancy: Secondary | ICD-10-CM

## 2022-08-26 DIAGNOSIS — O99013 Anemia complicating pregnancy, third trimester: Secondary | ICD-10-CM

## 2022-08-26 NOTE — Progress Notes (Signed)
   PRENATAL VISIT NOTE  Subjective:  Sara Price is a 32 y.o. [id number] at [redacted]w[redacted]d being seen today for ongoing prenatal care.  She is currently monitored for the following issues for this high-risk pregnancy and has Language barrier; Supervision of high risk pregnancy, antepartum; Late prenatal care affecting pregnancy in third trimester; and Abnormal CBC on their problem list.  Patient reports no complaints.  Contractions: Not present. Vag. Bleeding: None.  Movement: Present. Denies leaking of fluid.   The following portions of the patient's history were reviewed and updated as appropriate: allergies, current medications, past family history, past medical history, past social history, past surgical history and problem list. Problem list updated.  Objective:   Vitals:   08/26/22 1448  BP: 119/67  Pulse: 71  Weight: 166 lb 6.4 oz (75.5 kg)    Fetal Status: Fetal Heart Rate (bpm): 141 Fundal Height: 34 cm Movement: Present     General:  Alert, oriented and cooperative. Patient is in no acute distress.  Skin: Skin is warm and dry. No rash noted.   Cardiovascular: Normal heart rate noted  Respiratory: Normal respiratory effort, no problems with respiration noted  Abdomen: Soft, gravid, appropriate for gestational age.  Pain/Pressure: Absent     Pelvic: Cervical exam deferred        Extremities: Normal range of motion.  Edema: None  Mental Status: Normal mood and affect. Normal behavior. Normal judgment and thought content.   Assessment and Plan:  Pregnancy: [id number] at [redacted]w[redacted]d  1. Supervision of high risk pregnancy, antepartum - Routine care, no complaints or concerns - Preemptive discussion: GC and GBS swabs next visit  2. Anemia in pregnancy, third trimester - Compliant with prescribed PO Fe - Hgb 10.3 on 08/13/2022  3. [redacted] weeks gestation of pregnancy   4. Late Prenatal Care - Care initiated at [place]  5. Language barrier - In-person interpreter utilized for all  patient interaction  Preterm labor symptoms and general obstetric precautions including but not limited to vaginal bleeding, contractions, leaking of fluid and fetal movement were reviewed in detail with the patient. Please refer to After Visit Summary for other counseling recommendations.  Return in about 2 weeks (around 09/09/2022) for MD or APP, GBS next visit.  Future Appointments  Date Time Provider [place]  09/09/2022  2:30 PM Darlina Rumpf, [place] [facility]  09/16/2022  2:30 PM Aletha Halim, MD [facility]  09/23/2022  2:30 PM Darlina Rumpf, CNM [facility]  09/30/2022  2:30 PM Donnamae Jude, MD [facility]    Darlina Rumpf, CNM

## 2022-08-26 NOTE — Progress Notes (Signed)
CC:Routine OB   

## 2022-09-09 ENCOUNTER — Ambulatory Visit (INDEPENDENT_AMBULATORY_CARE_PROVIDER_SITE_OTHER): Payer: Self-pay | Admitting: Advanced Practice Midwife

## 2022-09-09 ENCOUNTER — Other Ambulatory Visit (HOSPITAL_COMMUNITY)
Admission: RE | Admit: 2022-09-09 | Discharge: 2022-09-09 | Disposition: A | Discharge status: Home / Self Care | Payer: Self-pay | Source: Ambulatory Visit | Attending: Advanced Practice Midwife | Admitting: Advanced Practice Midwife

## 2022-09-09 ENCOUNTER — Encounter: Payer: Self-pay | Admitting: Advanced Practice Midwife

## 2022-09-09 VITALS — BP 110/66 | HR 73 | Wt 170.0 lb

## 2022-09-09 DIAGNOSIS — O0990 Supervision of high risk pregnancy, unspecified, unspecified trimester: Secondary | ICD-10-CM

## 2022-09-09 DIAGNOSIS — O0993 Supervision of high risk pregnancy, unspecified, third trimester: Secondary | ICD-10-CM

## 2022-09-09 DIAGNOSIS — Z3A36 36 weeks gestation of pregnancy: Secondary | ICD-10-CM

## 2022-09-09 DIAGNOSIS — Z789 Other specified health status: Secondary | ICD-10-CM

## 2022-09-09 NOTE — Progress Notes (Signed)
   PRENATAL VISIT NOTE  Subjective:  Sara Price is a 32 y.o. [id number] at 26w4dbeing seen today for ongoing prenatal care.  She is currently monitored for the following issues for this high-risk pregnancy and has Language barrier; Supervision of high risk pregnancy, antepartum; Late prenatal care affecting pregnancy in third trimester; and Abnormal CBC on their problem list.  Patient reports no complaints.  Contractions: Not present. Vag. Bleeding: None.  Movement: Present. Denies leaking of fluid.   The following portions of the patient's history were reviewed and updated as appropriate: allergies, current medications, past family history, past medical history, past social history, past surgical history and problem list. Problem list updated.  Objective:   Vitals:   09/09/22 1433  BP: 110/66  Pulse: 73  Weight: 170 lb (77.1 kg)    Fetal Status: Fetal Heart Rate (bpm): 148   Movement: Present     General:  Alert, oriented and cooperative. Patient is in no acute distress.  Skin: Skin is warm and dry. No rash noted.   Cardiovascular: Normal heart rate noted  Respiratory: Normal respiratory effort, no problems with respiration noted  Abdomen: Soft, gravid, appropriate for gestational age.  Pain/Pressure: Absent     Pelvic: Cervical exam deferred        Extremities: Normal range of motion.  Edema: None  Mental Status: Normal mood and affect. Normal behavior. Normal judgment and thought content.   Assessment and Plan:  Pregnancy: [id number] 375w4d1. Supervision of high risk pregnancy, antepartum - No complaints or concerns today - Reviewed indications for evaluation in MAU, patient teachback  - Strep Gp B NAA - Cervicovaginal ancillary only( [place])  2. [redacted] weeks gestation of pregnancy   3. Language barrier - In-person interpreter NoGilberto Betterresent for visit  Preterm labor symptoms and general obstetric precautions including but not limited to vaginal bleeding,  contractions, leaking of fluid and fetal movement were reviewed in detail with the patient. Please refer to After Visit Summary for other counseling recommendations.    Future Appointments  Date Time Provider [place]/20[place]  2:30 PM PiAletha HalimMD [facility]  09/23/2022  2:30 PM WeDarlina RumpfCNM [facility]  09/30/2022  2:30 PM PrDonnamae JudeMD [facility]    SaDarlina RumpfCNM

## 2022-09-09 NOTE — Progress Notes (Signed)
ROB   CC: None

## 2022-09-11 ENCOUNTER — Encounter: Payer: Self-pay | Admitting: Advanced Practice Midwife

## 2022-09-11 DIAGNOSIS — Z22330 Carrier of Group B streptococcus: Secondary | ICD-10-CM | POA: Insufficient documentation

## 2022-09-11 LAB — STREP GP B NAA: Strep Gp B NAA: POSITIVE — AB

## 2022-09-13 LAB — CERVICOVAGINAL ANCILLARY ONLY
Chlamydia: NEGATIVE
Comment: NEGATIVE
Comment: NORMAL
Neisseria Gonorrhea: NEGATIVE

## 2022-09-16 ENCOUNTER — Ambulatory Visit (INDEPENDENT_AMBULATORY_CARE_PROVIDER_SITE_OTHER): Payer: Self-pay | Admitting: Obstetrics and Gynecology

## 2022-09-16 ENCOUNTER — Encounter: Payer: Self-pay | Admitting: Obstetrics and Gynecology

## 2022-09-16 VITALS — BP 110/74 | HR 61 | Wt 170.0 lb

## 2022-09-16 DIAGNOSIS — Z22330 Carrier of Group B streptococcus: Secondary | ICD-10-CM

## 2022-09-16 DIAGNOSIS — Z789 Other specified health status: Secondary | ICD-10-CM

## 2022-09-16 DIAGNOSIS — Z3A37 37 weeks gestation of pregnancy: Secondary | ICD-10-CM

## 2022-09-16 DIAGNOSIS — O0933 Supervision of pregnancy with insufficient antenatal care, third trimester: Secondary | ICD-10-CM

## 2022-09-16 NOTE — Progress Notes (Signed)
    PRENATAL VISIT NOTE  Subjective:  Sara Price is a 32 y.o. [id number] at 20w4dbeing seen today for ongoing prenatal care.  She is currently monitored for the following issues for this high-risk pregnancy and has Language barrier; Supervision of high risk pregnancy, antepartum; Late prenatal care affecting pregnancy in third trimester; Abnormal CBC; and GBS carrier on their problem list.  Patient reports no complaints.   . Vag. Bleeding: None.  Movement: Present. Denies leaking of fluid.   The following portions of the patient's history were reviewed and updated as appropriate: allergies, current medications, past family history, past medical history, past social history, past surgical history and problem list.   Objective:   Vitals:   09/16/22 1458  BP: 110/74  Pulse: 61  Weight: 170 lb (77.1 kg)    Fetal Status: Fetal Heart Rate (bpm): 142 Fundal Height: 37 cm Movement: Present  Presentation: Vertex  General:  Alert, oriented and cooperative. Patient is in no acute distress.  Skin: Skin is warm and dry. No rash noted.   Cardiovascular: Normal heart rate noted  Respiratory: Normal respiratory effort, no problems with respiration noted  Abdomen: Soft, gravid, appropriate for gestational age.  Pain/Pressure: Absent     Pelvic: Cervical exam deferred        Extremities: Normal range of motion.  Edema: None  Mental Status: Normal mood and affect. Normal behavior. Normal judgment and thought content.   Assessment and Plan:  Pregnancy: [id number] 352w4d. [redacted] weeks gestation of pregnancy [employer] patient. 2/12 [place] u/s wnl2/12, 2716gm, 61% and c/w her LMP  2. Late prenatal care affecting pregnancy in third trimester SW consult PP  3. GBS carrier  4. Language barrier In person interpreter used  Term labor symptoms and general obstetric precautions including but not limited to vaginal bleeding, contractions, leaking of fluid and fetal movement were reviewed in  detail with the patient. Please refer to After Visit Summary for other counseling recommendations.   No follow-ups on file.  Future Appointments  Date Time Provider De[place]3/01/2023  2:30 PM WeKieth BrightlyWH-[facility]SC[facility]  09/30/2022  2:30 PM PrDonnamae JudeMD [facility]    ChAletha HalimMD

## 2022-09-16 NOTE — Progress Notes (Signed)
ROB/GBS.  

## 2022-09-20 ENCOUNTER — Encounter: Payer: Self-pay | Admitting: *Deleted

## 2022-09-23 ENCOUNTER — Encounter: Payer: Self-pay | Admitting: Advanced Practice Midwife

## 2022-09-23 ENCOUNTER — Ambulatory Visit (INDEPENDENT_AMBULATORY_CARE_PROVIDER_SITE_OTHER): Payer: Self-pay | Admitting: Advanced Practice Midwife

## 2022-09-23 VITALS — BP 118/74 | HR 76 | Wt 176.0 lb

## 2022-09-23 DIAGNOSIS — O99013 Anemia complicating pregnancy, third trimester: Secondary | ICD-10-CM

## 2022-09-23 DIAGNOSIS — Z3A38 38 weeks gestation of pregnancy: Secondary | ICD-10-CM

## 2022-09-23 DIAGNOSIS — O0990 Supervision of high risk pregnancy, unspecified, unspecified trimester: Secondary | ICD-10-CM

## 2022-09-23 DIAGNOSIS — Z789 Other specified health status: Secondary | ICD-10-CM

## 2022-09-23 DIAGNOSIS — O0933 Supervision of pregnancy with insufficient antenatal care, third trimester: Secondary | ICD-10-CM

## 2022-09-23 DIAGNOSIS — O0993 Supervision of high risk pregnancy, unspecified, third trimester: Secondary | ICD-10-CM

## 2022-09-23 DIAGNOSIS — Z22330 Carrier of Group B streptococcus: Secondary | ICD-10-CM

## 2022-09-23 NOTE — Progress Notes (Signed)
   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  She is currently monitored for the following issues for this high-risk pregnancy and has Language barrier; Supervision of high risk pregnancy, antepartum; Late prenatal care affecting pregnancy in third trimester; Abnormal CBC; and GBS carrier on their problem list.  Patient reports no complaints.  Contractions: Not present. Vag. Bleeding: None.  Movement: Present. Denies leaking of fluid.   The following portions of the patient's history were reviewed and updated as appropriate: allergies, current medications, past family history, past medical history, past social history, past surgical history and problem list. Problem list updated.  Objective:   Vitals:   09/23/22 1430  BP: 118/74  Pulse: 76  Weight: 176 lb (79.8 kg)    Fetal Status: Fetal Heart Rate (bpm): 142 Fundal Height: 38 cm Movement: Present     General:  Alert, oriented and cooperative. Patient is in no acute distress.  Skin: Skin is warm and dry. No rash noted.   Cardiovascular: Normal heart rate noted  Respiratory: Normal respiratory effort, no problems with respiration noted  Abdomen: Soft, gravid, appropriate for gestational age.  Pain/Pressure: Absent     Pelvic: Cervical exam deferred Declined by patient        Extremities: Normal range of motion.     Mental Status: Normal mood and affect. Normal behavior. Normal judgment and thought content.   Assessment and Plan:  Pregnancy: [id number] 360w4d1. Supervision of high risk pregnancy, antepartum - No complaints or concerns - Declines cervical exam, not feeling regular contractions - FH appropriate for GA  2. GBS carrier - PCN in labor per protocol  3. Late prenatal care affecting pregnancy in third trimester - Inpatient LCSW consult please  4. Anemia in pregnancy, third trimester - Hgb 10.3 08/13/2022  5. [redacted] weeks  gestation of pregnancy   6. Language barrier - In-person interpreter NoGilberto Betterresent for all patient interaction  Term labor symptoms and general obstetric precautions including but not limited to vaginal bleeding, contractions, leaking of fluid and fetal movement were reviewed in detail with the patient. Please refer to After Visit Summary for other counseling recommendations.   Future Appointments  Date Time Provider [place]3/14/2024  2:30 PM PrDonnamae JudeMD [facility]    SaDarlina RumpfCNNorth Dakota

## 2022-09-25 ENCOUNTER — Inpatient Hospital Stay (HOSPITAL_COMMUNITY)
Admission: AD | Admit: 2022-09-25 | Discharge: 2022-09-28 | DRG: 807 | Disposition: A | Discharge status: Home / Self Care | Payer: Medicaid Other | Attending: Obstetrics & Gynecology | Admitting: Obstetrics & Gynecology

## 2022-09-25 DIAGNOSIS — Z3009 Encounter for other general counseling and advice on contraception: Secondary | ICD-10-CM

## 2022-09-25 DIAGNOSIS — Z3A39 39 weeks gestation of pregnancy: Secondary | ICD-10-CM

## 2022-09-25 DIAGNOSIS — R7989 Other specified abnormal findings of blood chemistry: Secondary | ICD-10-CM | POA: Diagnosis present

## 2022-09-25 DIAGNOSIS — O0933 Supervision of pregnancy with insufficient antenatal care, third trimester: Secondary | ICD-10-CM

## 2022-09-25 DIAGNOSIS — Z22330 Carrier of Group B streptococcus: Secondary | ICD-10-CM

## 2022-09-25 DIAGNOSIS — Z30017 Encounter for initial prescription of implantable subdermal contraceptive: Secondary | ICD-10-CM

## 2022-09-25 DIAGNOSIS — O288 Other abnormal findings on antenatal screening of mother: Secondary | ICD-10-CM | POA: Diagnosis present

## 2022-09-25 DIAGNOSIS — O0990 Supervision of high risk pregnancy, unspecified, unspecified trimester: Secondary | ICD-10-CM

## 2022-09-25 DIAGNOSIS — O4693 Antepartum hemorrhage, unspecified, third trimester: Secondary | ICD-10-CM | POA: Insufficient documentation

## 2022-09-25 DIAGNOSIS — O99824 Streptococcus B carrier state complicating childbirth: Secondary | ICD-10-CM | POA: Diagnosis present

## 2022-09-25 DIAGNOSIS — Z789 Other specified health status: Secondary | ICD-10-CM | POA: Diagnosis present

## 2022-09-25 NOTE — MAU Note (Signed)
.  Sara Price is a 32 y.o. at [redacted]w[redacted]d here in MAU reporting: vaginal bleeding like a period x 1 hour. Reports some cramping q 30 min.  Denies any leaking and reports good fetal movement. LMP:  Onset of complaint: 1 hour Pain score: 6 Vitals:   09/26/22 0001  BP: 122/73  Pulse: 70  Resp: 18  Temp: 98.2 F (36.8 C)     FHT:132 Lab orders placed from triage:  labor eval

## 2022-09-26 ENCOUNTER — Encounter (HOSPITAL_COMMUNITY): Payer: Self-pay | Admitting: Obstetrics & Gynecology

## 2022-09-26 ENCOUNTER — Other Ambulatory Visit: Payer: Self-pay

## 2022-09-26 DIAGNOSIS — O288 Other abnormal findings on antenatal screening of mother: Secondary | ICD-10-CM | POA: Diagnosis present

## 2022-09-26 DIAGNOSIS — O368330 Maternal care for abnormalities of the fetal heart rate or rhythm, third trimester, not applicable or unspecified: Secondary | ICD-10-CM

## 2022-09-26 DIAGNOSIS — O99824 Streptococcus B carrier state complicating childbirth: Secondary | ICD-10-CM | POA: Diagnosis present

## 2022-09-26 DIAGNOSIS — O4693 Antepartum hemorrhage, unspecified, third trimester: Secondary | ICD-10-CM | POA: Insufficient documentation

## 2022-09-26 DIAGNOSIS — O99820 Streptococcus B carrier state complicating pregnancy: Secondary | ICD-10-CM

## 2022-09-26 DIAGNOSIS — O26893 Other specified pregnancy related conditions, third trimester: Secondary | ICD-10-CM | POA: Diagnosis present

## 2022-09-26 DIAGNOSIS — Z30017 Encounter for initial prescription of implantable subdermal contraceptive: Secondary | ICD-10-CM | POA: Diagnosis not present

## 2022-09-26 DIAGNOSIS — Z3A39 39 weeks gestation of pregnancy: Secondary | ICD-10-CM

## 2022-09-26 LAB — CBC
HCT: 33.9 % — ABNORMAL LOW (ref 36.0–46.0)
Hemoglobin: 11.4 g/dL — ABNORMAL LOW (ref 12.0–15.0)
MCH: 29.8 pg (ref 26.0–34.0)
MCHC: 33.6 g/dL (ref 30.0–36.0)
MCV: 88.7 fL (ref 80.0–100.0)
Platelets: 205 10*3/uL (ref 150–400)
RBC: 3.82 MIL/uL — ABNORMAL LOW (ref 3.87–5.11)
RDW: 16 % — ABNORMAL HIGH (ref 11.5–15.5)
WBC: 10.2 10*3/uL (ref 4.0–10.5)
nRBC: 0 % (ref 0.0–0.2)

## 2022-09-26 LAB — TYPE AND SCREEN
ABO/RH(D): O POS
Antibody Screen: NEGATIVE

## 2022-09-26 LAB — RPR: RPR Ser Ql: NONREACTIVE

## 2022-09-26 MED ORDER — SODIUM CHLORIDE 0.9 % IV SOLN
5.0000 10*6.[IU] | Freq: Once | INTRAVENOUS | Status: AC
  Administered 2022-09-26: 5 10*6.[IU] via INTRAVENOUS
  Filled 2022-09-26: qty 5

## 2022-09-26 MED ORDER — ONDANSETRON HCL 4 MG/2ML IJ SOLN: 4.0000 mg | Freq: Four times a day (QID) | INTRAMUSCULAR | Status: DC | PRN

## 2022-09-26 MED ORDER — BENZOCAINE-MENTHOL 20-0.5 % EX AERO: 1.0000 | INHALATION_SPRAY | CUTANEOUS | Status: DC | PRN

## 2022-09-26 MED ORDER — DIBUCAINE (PERIANAL) 1 % EX OINT: 1.0000 | TOPICAL_OINTMENT | CUTANEOUS | Status: DC | PRN

## 2022-09-26 MED ORDER — OXYCODONE-ACETAMINOPHEN 5-325 MG PO TABS: 2.0000 | ORAL_TABLET | ORAL | Status: DC | PRN

## 2022-09-26 MED ORDER — ONDANSETRON HCL 4 MG PO TABS: 4.0000 mg | ORAL_TABLET | ORAL | Status: DC | PRN

## 2022-09-26 MED ORDER — WITCH HAZEL-GLYCERIN EX PADS: 1.0000 | MEDICATED_PAD | CUTANEOUS | Status: DC | PRN

## 2022-09-26 MED ORDER — ONDANSETRON HCL 4 MG/2ML IJ SOLN: 4.0000 mg | INTRAMUSCULAR | Status: DC | PRN

## 2022-09-26 MED ORDER — OXYTOCIN-SODIUM CHLORIDE 30-0.9 UT/500ML-% IV SOLN
2.5000 [IU]/h | INTRAVENOUS | Status: DC
  Filled 2022-09-26: qty 500

## 2022-09-26 MED ORDER — OXYTOCIN BOLUS FROM INFUSION: 333.0000 mL | Freq: Once | INTRAVENOUS | Status: DC

## 2022-09-26 MED ORDER — ACETAMINOPHEN 325 MG PO TABS: 650.0000 mg | ORAL_TABLET | ORAL | Status: DC | PRN

## 2022-09-26 MED ORDER — TETANUS-DIPHTH-ACELL PERTUSSIS 5-2.5-18.5 LF-MCG/0.5 IM SUSY: 0.5000 mL | PREFILLED_SYRINGE | Freq: Once | INTRAMUSCULAR | Status: DC

## 2022-09-26 MED ORDER — PRENATAL MULTIVITAMIN CH
1.0000 | ORAL_TABLET | Freq: Every day | ORAL | Status: DC
  Administered 2022-09-27 – 2022-09-28 (×2): 1 via ORAL
  Filled 2022-09-26 (×2): qty 1

## 2022-09-26 MED ORDER — SOD CITRATE-CITRIC ACID 500-334 MG/5ML PO SOLN: 30.0000 mL | ORAL | Status: DC | PRN

## 2022-09-26 MED ORDER — DIPHENHYDRAMINE HCL 25 MG PO CAPS: 25.0000 mg | ORAL_CAPSULE | Freq: Four times a day (QID) | ORAL | Status: DC | PRN

## 2022-09-26 MED ORDER — LACTATED RINGERS IV SOLN: 500.0000 mL | INTRAVENOUS | Status: DC | PRN

## 2022-09-26 MED ORDER — COCONUT OIL OIL: 1.0000 | TOPICAL_OIL | Status: DC | PRN

## 2022-09-26 MED ORDER — LACTATED RINGERS IV SOLN: INTRAVENOUS | Status: DC

## 2022-09-26 MED ORDER — OXYCODONE-ACETAMINOPHEN 5-325 MG PO TABS: 1.0000 | ORAL_TABLET | ORAL | Status: DC | PRN

## 2022-09-26 MED ORDER — ZOLPIDEM TARTRATE 5 MG PO TABS: 5.0000 mg | ORAL_TABLET | Freq: Every evening | ORAL | Status: DC | PRN

## 2022-09-26 MED ORDER — LIDOCAINE HCL (PF) 1 % IJ SOLN: 30.0000 mL | INTRAMUSCULAR | Status: DC | PRN

## 2022-09-26 MED ORDER — MISOPROSTOL 25 MCG QUARTER TABLET
25.0000 ug | ORAL_TABLET | Freq: Once | ORAL | Status: AC
  Administered 2022-09-26: 25 ug via VAGINAL
  Filled 2022-09-26: qty 1

## 2022-09-26 MED ORDER — SIMETHICONE 80 MG PO CHEW: 80.0000 mg | CHEWABLE_TABLET | ORAL | Status: DC | PRN

## 2022-09-26 MED ORDER — FLEET ENEMA 7-19 GM/118ML RE ENEM: 1.0000 | ENEMA | RECTAL | Status: DC | PRN

## 2022-09-26 MED ORDER — IBUPROFEN 600 MG PO TABS
600.0000 mg | ORAL_TABLET | Freq: Four times a day (QID) | ORAL | Status: DC
  Administered 2022-09-26 – 2022-09-28 (×8): 600 mg via ORAL
  Filled 2022-09-26 (×8): qty 1

## 2022-09-26 MED ORDER — PENICILLIN G POT IN DEXTROSE 60000 UNIT/ML IV SOLN
3.0000 10*6.[IU] | INTRAVENOUS | Status: DC
  Administered 2022-09-26: 3 10*6.[IU] via INTRAVENOUS
  Filled 2022-09-26: qty 50

## 2022-09-26 MED ORDER — SENNOSIDES-DOCUSATE SODIUM 8.6-50 MG PO TABS
2.0000 | ORAL_TABLET | Freq: Every day | ORAL | Status: DC
  Administered 2022-09-27 – 2022-09-28 (×2): 2 via ORAL
  Filled 2022-09-26 (×2): qty 2

## 2022-09-26 NOTE — Lactation Note (Signed)
This note was copied from a baby's chart. Lactation Consultation Note  Patient Name: Sara Price [id number] Date: 09/26/2022 Age:32 yearsours Reason for consult: Initial assessment;Term Consult was done in Spanish: Initial visit to 36 hours old infant. Birthing parent states breastfeeding going. Per parent, she is not experiencing  pain or discomfort with latch. Parent reports feeding recently and declined assistance to latch at this time. Discussed normal newborn behavior and patterns, signs of good milk transfer, hunger cues, tummy size and benefits of skin to skin.   Plan: 1-Breastfeeding on demand or 8-12 times in 24h period. 2-Encouraged birthing parent rest, hydration and food intake.   Contact LC as needed for feeds/support/concerns/questions. All questions answered at this time. Provided Lactation services brochure.   Maternal Data Has patient been taught Hand Expression?: No Does the patient have breastfeeding experience prior to this delivery?: Yes How long did the patient breastfeed?: 70-month 1st child; 685-month2nd child  Feeding Mother's Current Feeding Choice: Breast Milk  LATCH Score No latch observed during this encounter.   Interventions Interventions: Breast feeding basics reviewed;Skin to skin;Expressed milk;Education;LC Services brochure  Discharge Pump: Hands Free [facility] Hospitalrogram: Yes  Consult Status Consult Status: Follow-up Date: 09/27/22 Follow-up type: In-patient    Gurfateh Mcclain A Higuera Ancidey 09/26/2022, 6:19 PM

## 2022-09-26 NOTE — MAU Note (Signed)
SVE-no vaginal bleeding on glove-uterus remains soft and non tender. Len Blalock CNM viewed FHR tracing. Pt tilted to L side.

## 2022-09-26 NOTE — H&P (Signed)
OBSTETRIC ADMISSION HISTORY AND PHYSICAL  Sara Price is a 32 y.o. female [phone] with IUP at 59w0dby LMP presenting for vaginal bleeding at term. She reports +FMs, No LOF, no blurry vision, headaches or peripheral edema, and RUQ pain.  She plans on breast and bottle feeding.  Upon arrival to MAU, NST was non reactive with occasional variables and early decelerations.  [facility]   Dating: By LMP --->  Estimated Date of Delivery: 10/03/22  Nursing Staff Provider  [place]  10/03/2022, by Last Menstrual Period  [facility] AlexandriaModel '[ ]'$  Traditional '[ ]'$  Centering '[ ]'$  Mom-Baby Dyad    Language  Spanish Anatomy [place]   Flu Vaccine   Genetic/Carrier Screen  NIPS:    AFP:    Horizon:  TDaP Vaccine    Hgb A1C or  GTT Early  Third trimester wnl  COVID Vaccine Never    LAB RESULTS   Rhogam     Blood Type     Baby Feeding Plan Both  Antibody    Contraception  Rubella    Circumcision No  RPR     Pediatrician   HBsAg     Support Person FOB  HCVAb     Prenatal Classes  HIV       BTL Consent  GBS   (For PCN allergy, check sensitivities)   VBAC Consent  Pap Diagnosis  Date Value Ref Range Status  02/05/2020   Final   - Negative for intraepithelial lesion or malignancy (NILM)         DME Rx '[ ]'$  BP cuff '[ ]'$  Weight Scale Waterbirth  '[ ]'$  Class '[ ]'$  Consent '[ ]'$  CNM visit  PHQ9 & GAD7 [  x] new OB [  ] 28 weeks  [  ] 36 weeks Induction  '[ ]'$  Orders Entered '[ ]'$ Foley Y/N     Prenatal History/Complications: GBS pos  Past Medical History: Past Medical History:  Diagnosis Date   Medical history non-contributory     Past Surgical History: Past Surgical History:  Procedure Laterality Date   NO PAST SURGERIES      Obstetrical History: OB History     Gravida  4   Para  2   Term  2   Preterm      AB  1   Living  2      SAB  1   IAB      Ectopic      Multiple  0   Live Births  2           Social  History Social History   Socioeconomic History   Marital status: Married    Spouse name: Not on file   Number of children: Not on file   Years of education: Not on file   Highest education level: Not on file  Occupational History   Not on file  Tobacco Use   Smoking status: Never   Smokeless tobacco: Never  Vaping Use   Vaping Use: Never used  Substance and Sexual Activity   Alcohol use: No   Drug use: No   Sexual activity: Yes    Partners: Male    Birth control/protection: None    Comment: Pregnant  Other Topics Concern   Not on file  Social History Narrative   Not on file   Social Determinants of Health   Financial Resource Strain: Not on file  Food  Insecurity: No Food Insecurity (05/21/2020)   Hunger Vital Sign    Worried About Running Out of Food in the Last Year: Never true    Ran Out of Food in the Last Year: Never true  Transportation Needs: Not on file  Physical Activity: Not on file  Stress: Not on file  Social Connections: Not on file    Family History: Family History  Problem Relation Age of Onset   Diabetes Father     Allergies: No Known Allergies  Medications Prior to Admission  Medication Sig Dispense Refill Last Dose   Prenatal Vit-Fe Fumarate-FA (PRENATAL VITAMINS) 28-0.8 MG TABS Take 1 tablet by mouth daily.    09/25/2022   acetaminophen (TYLENOL) 325 MG tablet Take 2 tablets (650 mg total) by mouth every 4 (four) hours as needed (for pain scale < 4). (Patient not taking: Reported on 05/28/2020) 30 tablet 0    ferrous sulfate (FERROUSUL) 325 (65 FE) MG tablet Take 1 tablet (325 mg total) by mouth every other day. 60 tablet 1    ibuprofen (ADVIL) 600 MG tablet Take 1 tablet (600 mg total) by mouth every 6 (six) hours. (Patient not taking: Reported on 08/13/2022) 30 tablet 0    Review of Systems   All systems reviewed and negative except as stated in HPI  Blood pressure 122/73, pulse 70, temperature 98.2 F (36.8 C), resp. rate 18, height '5\' 2"'$   (1.575 m), weight 78 kg, last menstrual period 12/27/2021, unknown if currently breastfeeding. General appearance: alert, cooperative, and no distress Lungs: clear to auscultation bilaterally Heart: regular rate and rhythm Abdomen: soft, non-tender; bowel sounds normal Pelvic: n/a Extremities: Homans sign is negative, no sign of DVT DTR's +2 Presentation: cephalic Fetal monitoringBaseline: 120 bpm, Variability: Good {> 6 bpm), Accelerations: Reactive, and Decelerations: Absent Uterine activityFrequency: Every 2-7 minutes Dilation: Fingertip Effacement (%): Thick Station: Ballotable Exam by:: Apolinar Junes RN   Prenatal labs: ABO, Rh: O/Positive/-- (01/26 1101) Antibody: Negative (01/26 1101) Rubella: 8.70 (01/26 1101) RPR: Non Reactive (01/26 1101)  HBsAg: Negative (01/26 1101)  HIV: Non Reactive (01/26 1101)  GBS: Positive/-- (02/22 0251)   Prenatal Transfer Tool  Maternal Diabetes: No Genetic Screening: Normal Maternal Ultrasounds/Referrals: Normal Fetal Ultrasounds or other Referrals:  None Maternal Substance Abuse:  No Significant Maternal Medications:  None Significant Maternal Lab Results:  Group B Strep positive Number of Prenatal Visits:greater than 3 verified prenatal visits Other Comments:  None  No results found for this or any previous visit (from the past 24 hour(s)).  Patient Active Problem List   Diagnosis Date Noted   GBS carrier 09/11/2022   Abnormal CBC 08/16/2022   Supervision of high risk pregnancy, antepartum 08/13/2022   Late prenatal care affecting pregnancy in third trimester 08/13/2022   Language barrier 11/29/2016    Assessment/Plan:  Sara Price is a 32 y.o. [id number] at 52w0dhere for IOL for non reactive NST at term and vaginal bleeding  #Labor: plan for FB and pitocin augmentation #Pain: Per patient request #FWB: Cat 2 due to no accels and occasional variables, IV tarted #ID:  GBS pos #MOF: both #MOC: undecided #Circ:   no  CWende Mott CNM  09/26/2022, 1:15 AM

## 2022-09-26 NOTE — Discharge Summary (Signed)
Postpartum Discharge Summary  Date of Service updated***     Patient Name: Sara Price DOB: Mar 31, 1991 MRN: [id number]  Date of admission: 09/25/2022 Delivery date:09/26/2022  Delivering provider: Gerlene Fee  Date of discharge: 09/26/2022  Admitting diagnosis: Non-reactive NST (non-stress test) [O28.8] Intrauterine pregnancy: [redacted]w[redacted]d    Secondary diagnosis:  Principal Problem:   Non-reactive NST (non-stress test) Active Problems:   Language barrier   Supervision of high risk pregnancy, antepartum   Late prenatal care affecting pregnancy in third trimester   GBS carrier   Vaginal bleeding in pregnancy, third trimester  Additional problems: ***    Discharge diagnosis: {DX.:23714}                                              Post partum procedures:{Postpartum procedures:23558} Augmentation: Cytotec and IP Foley Complications: None  Hospital course: Induction of Labor With Vaginal Delivery   32y.o. yo [id number] 310w0das admitted to the hospital 09/25/2022 for induction of labor.  Indication for induction:  vaginal bleeding and NRFHT .  Patient had an labor course was uncomplicated Membrane Rupture Time/Date: 11:13 AM ,09/26/2022   Delivery Method:Vaginal, Spontaneous  Episiotomy:   Lacerations:    Details of delivery can be found in separate delivery note.  Patient had a postpartum course complicated by***. Patient is discharged home 09/26/22.  Newborn Data: Birth date:09/26/2022  Birth time:11:51 AM  Gender:Female  Living status:Living  Apgars:8 ,9  Weight:3740 g   Magnesium Sulfate received: No BMZ received: No Rhophylac:No MMR:{MMR:30440033} T-DaP:{Tdap:23962} Flu: {FWU:107179ransfusion:{Transfusion received:30440034}  Physical exam  Vitals:   09/26/22 1130 09/26/22 1135 09/26/22 1140 09/26/22 1200  BP: 119/72 122/76  (!) 117/59  Pulse: 66 73  71  Resp:      Temp:      TempSrc:      SpO2:   96%   Weight:      Height:       General:  {Exam; general:21111117} Lochia: {Desc; appropriate/inappropriate:30686::"appropriate"} Uterine Fundus: {Desc; firm/soft:30687} Incision: {Exam; incision:21111123} DVT Evaluation: {Exam; dvt:2111122} Labs: Lab Results  Component Value Date   WBC 10.2 09/26/2022   HGB 11.4 (L) 09/26/2022   HCT 33.9 (L) 09/26/2022   MCV 88.7 09/26/2022   PLT 205 09/26/2022       No data to display         Edinburgh Score:    06/06/2020    9:10 AM  Edinburgh Postnatal Depression Scale Screening Tool  I have been able to laugh and see the funny side of things. 0  I have looked forward with enjoyment to things. 0  I have blamed myself unnecessarily when things went wrong. 0  I have been anxious or worried for no good reason. 0  I have felt scared or panicky for no good reason. 0  Things have been getting on top of me. 0  I have been so unhappy that I have had difficulty sleeping. 0  I have felt sad or miserable. 0  I have been so unhappy that I have been crying. 0  The thought of harming myself has occurred to me. 0  Edinburgh Postnatal Depression Scale Total 0     After visit meds:  Allergies as of 09/26/2022   No Known Allergies   [facility] must be completed [facility]  Discharge home in stable condition Infant Feeding: {Baby feeding:23562} Infant Disposition:{CHL IP OB HOME WITH DX:[id number] Discharge instruction: per After Visit Summary and Postpartum booklet. Activity: Advance as tolerated. Pelvic rest for 6 weeks.  Diet: {OB BY:630183 Future Appointments: Future Appointments  Date Time Provider Department Center  09/30/2022  2:30 PM Donnamae Jude, MD [facility]   Follow up Visit:  Message sent to [facility] by Autry-Lott on 09/26/2022  Please schedule this patient for a In person postpartum visit in 6 weeks with the following provider: MD. Additional Postpartum F/U: Repeat CBC at 6 weeks PP with peripheral smear due to Metamyelovytes  on CBC 07/2022 Low risk pregnancy complicated by:  late to care, NRNST, GBS, abnormal CBC, vaginal bleeding Delivery mode:  Vaginal, Spontaneous  Anticipated Birth Control:  Unsure   09/26/2022 Naaman Plummer Autry-Lott, DO

## 2022-09-26 NOTE — Progress Notes (Signed)
Labor Progress Note Suong Bienaime is a 32 y.o. [id number] at 64w0dpresented for vaginal bleeding, NRFHT.  S: No acute concerns.   O:  BP 124/61   Pulse 67   Temp (!) 97.4 F (36.3 C) (Axillary)   Resp 16   Ht '5\' 2"'$  (1.575 m)   Wt 78 kg   LMP 12/27/2021   SpO2 99%   BMI 31.46 kg/m  EFM: 135bpm/moderate/+accels, early decel  CVE: Dilation: 6.5 Effacement (%): 80 Cervical Position: Posterior Station: -2 Presentation:  (Clement Husbands Exam by:: brittany f  A&P: 89y.o. GRN:[id number] for vaginal bleeding, NRFHT. IOL.  #Labor: Progressing well s/p cytotec x1. S/p AROM, clear.  #Pain: Maternally supported #FWB: Cat I  #GBS positive  Meryem Haertel Autry-Lott, DO 10:24 AM

## 2022-09-27 ENCOUNTER — Other Ambulatory Visit (HOSPITAL_COMMUNITY): Payer: Self-pay

## 2022-09-27 ENCOUNTER — Telehealth: Payer: Self-pay

## 2022-09-27 LAB — CBC
HCT: 32.3 % — ABNORMAL LOW (ref 36.0–46.0)
Hemoglobin: 10.5 g/dL — ABNORMAL LOW (ref 12.0–15.0)
MCH: 29.2 pg (ref 26.0–34.0)
MCHC: 32.5 g/dL (ref 30.0–36.0)
MCV: 90 fL (ref 80.0–100.0)
Platelets: 184 10*3/uL (ref 150–400)
RBC: 3.59 MIL/uL — ABNORMAL LOW (ref 3.87–5.11)
RDW: 16.1 % — ABNORMAL HIGH (ref 11.5–15.5)
WBC: 11.6 10*3/uL — ABNORMAL HIGH (ref 4.0–10.5)
nRBC: 0 % (ref 0.0–0.2)

## 2022-09-27 MED ORDER — ACETAMINOPHEN 325 MG PO TABS
650.0000 mg | ORAL_TABLET | Freq: Four times a day (QID) | ORAL | 0 refills | Status: AC | PRN
  Filled 2022-09-27: qty 30, 4d supply, fill #0

## 2022-09-27 MED ORDER — IBUPROFEN 600 MG PO TABS
600.0000 mg | ORAL_TABLET | Freq: Four times a day (QID) | ORAL | 0 refills | Status: AC
  Filled 2022-09-27: qty 30, 8d supply, fill #0

## 2022-09-27 MED ORDER — SENNOSIDES-DOCUSATE SODIUM 8.6-50 MG PO TABS
2.0000 | ORAL_TABLET | Freq: Every day | ORAL | 0 refills | Status: AC
  Filled 2022-09-27: qty 30, 15d supply, fill #0

## 2022-09-27 MED ORDER — BENZOCAINE-MENTHOL 20-0.5 % EX AERO
1.0000 | INHALATION_SPRAY | CUTANEOUS | 0 refills | Status: AC | PRN
  Filled 2022-09-27: qty 78, fill #0

## 2022-09-27 NOTE — Telephone Encounter (Signed)
Called pt via interpreter services to notify of PP appt. Husband spoke english and verbalized understanding

## 2022-09-27 NOTE — Progress Notes (Signed)
Patient prefers using spouse in room for interpretation services while he is present. We have offered mother of baby to sign the waiver to not use the interpretation services, although she would like to keep that available in case father of the baby leaves the premises.   Sara Price

## 2022-09-27 NOTE — Lactation Note (Signed)
This note was copied from a baby's chart. Lactation Consultation Note  Patient Name: Sara Price [id number] Date: 09/27/2022 Age:33 hours  Reason for consult: Follow-up assessment;[place] in with Lactation Consultant to see mother/ infant and inquire about breastfeeding, questions or concerns. Mother states baby is breastfeeding good, she does not have any concerns, reports history of breastfeeding for 2 years with her last baby, has a pump at home, if needed.   Mother has exclusively breast fed since baby was born.  Mother reports she is aware of O/P services, breastfeeding support groups, community resources, and our phone # for post-discharge questions.    LC to follow upon mother's request.   Feeding Mother's Current Feeding Choice: Breast Milk   Consult Status Consult Status: Complete Date: 09/27/22    Gwenevere Abbot 09/27/2022, 6:29 PM

## 2022-09-27 NOTE — Progress Notes (Signed)
Dr. Clement Husbands notified this RN that if infant could not be discharged today, then mother's discharge can be canceled. Pediatrician notified this RN that infant will not be discharged today. Called [place]ner, CNM of infant's stay through the night in which she ordered to cancel discharge. Maxwell Caul, Leretha Dykes [place]

## 2022-09-28 ENCOUNTER — Encounter (HOSPITAL_COMMUNITY): Payer: Self-pay | Admitting: Obstetrics & Gynecology

## 2022-09-28 DIAGNOSIS — Z30017 Encounter for initial prescription of implantable subdermal contraceptive: Secondary | ICD-10-CM

## 2022-09-28 DIAGNOSIS — Z3009 Encounter for other general counseling and advice on contraception: Secondary | ICD-10-CM

## 2022-09-28 HISTORY — PX: SUBDERMAL ETONOGESTREL IMPLANT INSERTION: PRO7525

## 2022-09-28 MED ORDER — LIDOCAINE HCL 1 % IJ SOLN
0.0000 mL | Freq: Once | INTRAMUSCULAR | Status: AC | PRN
  Administered 2022-09-28: 20 mL via INTRADERMAL
  Filled 2022-09-28: qty 20

## 2022-09-28 MED ORDER — ETONOGESTREL 68 MG ~~LOC~~ IMPL
68.0000 mg | DRUG_IMPLANT | Freq: Once | SUBCUTANEOUS | Status: AC
  Administered 2022-09-28: 68 mg via SUBCUTANEOUS
  Filled 2022-09-28: qty 1

## 2022-09-28 NOTE — Progress Notes (Signed)
Discharge education reviewed with patient using Angelica Chessman, in house/in person interpreter. Maxwell Caul, Leretha Dykes [place]

## 2022-09-28 NOTE — Discharge Summary (Signed)
Postpartum Discharge Summary    Patient Name: Sara Price DOB: 1990-10-27 MRN: [id number]  Date of admission: 09/25/2022 Delivery date:09/26/2022  Delivering provider: Gerlene Fee  Date of discharge: 09/28/2022  Admitting diagnosis: Non-reactive NST (non-stress test) [O28.8] Intrauterine pregnancy: [redacted]w[redacted]d    Secondary diagnosis:  Principal Problem:   Vaginal delivery Active Problems:   Language barrier   Supervision of high risk pregnancy, antepartum   Late prenatal care affecting pregnancy in third trimester   Abnormal CBC   GBS carrier   Non-reactive NST (non-stress test)   Vaginal bleeding in pregnancy, third trimester  Additional problems: n/a    Discharge diagnosis: Term Pregnancy Delivered                                              Post partum procedures: n/a Augmentation: Cytotec and IP Foley Complications: None  Hospital course: Induction of Labor With Vaginal Delivery   32y.o. yo [id number] admitted to the hospital 09/25/2022 for induction of labor.  Indication for induction:  vaginal bleeding and NRFHT .  Patient had an labor course was uncomplicated Membrane Rupture Time/Date: 11:13 AM ,09/26/2022   Delivery Method:Vaginal, Spontaneous  Episiotomy: None  Lacerations:  None  Details of delivery can be found in separate delivery note.  Patient had a postpartum course complicated by none. Patient is discharged home 09/28/22.  Newborn Data: Birth date:09/26/2022  Birth time:11:51 AM  Gender:Female  Living status:Living  Apgars:8 ,9  Weight:3740 g   Magnesium Sulfate received: No BMZ received: No Rhophylac:No MMR:N/A T-DaP: no Flu: No Transfusion:No  Physical exam  Vitals:   09/27/22 0606 09/27/22 1416 09/27/22 2030 09/28/22 0559  BP: 101/61 107/65 107/62 103/62  Pulse: 75 72 66 66  Resp: '17 18 18 18  '$ Temp: 97.6 F (36.4 C) 98 F (36.7 C) 98 F (36.7 C)   TempSrc: Oral Oral Oral   SpO2: 99% 100% 100% 100%  Weight:       Height:       General: alert, cooperative, and no distress Lochia: appropriate Uterine Fundus: firm Incision: N/A DVT Evaluation: No evidence of DVT seen on physical exam. Labs: Lab Results  Component Value Date   WBC 11.6 (H) 09/27/2022   HGB 10.5 (L) 09/27/2022   HCT 32.3 (L) 09/27/2022   MCV 90.0 09/27/2022   PLT 184 09/27/2022       No data to display          Edinburgh Score:    09/27/2022   12:15 PM  Edinburgh Postnatal Depression Scale Screening Tool  I have been able to laugh and see the funny side of things. 0  I have looked forward with enjoyment to things. 0  I have blamed myself unnecessarily when things went wrong. 0  I have been anxious or worried for no good reason. 0  I have felt scared or panicky for no good reason. 0  Things have been getting on top of me. 0  I have been so unhappy that I have had difficulty sleeping. 0  I have felt sad or miserable. 0  I have been so unhappy that I have been crying. 0  The thought of harming myself has occurred to me. 0  Edinburgh Postnatal Depression Scale Total 0     After visit meds:  Allergies as of 09/28/2022   No Known Allergies  Medication List     STOP taking these medications    ferrous sulfate 325 (65 FE) MG tablet Commonly known as: FerrouSul   Prenatal Vitamins 28-0.8 MG Tabs       TAKE these medications    acetaminophen 325 MG tablet Commonly known as: Tylenol Take 2 tablets (650 mg total) by mouth every 6 (six) hours as needed. What changed:  when to take this reasons to take this   benzocaine-Menthol 20-0.5 % Aero Commonly known as: DERMOPLAST Apply 1 Application topically as needed for irritation (perineal discomfort).   ibuprofen 600 MG tablet Commonly known as: ADVIL Take 1 tablet (600 mg total) by mouth every 6 (six) hours.   Senexon-S 8.6-50 MG tablet Generic drug: senna-docusate Take 2 tablets by mouth daily.         Discharge home in stable  condition Infant Feeding: Bottle and Breast Infant Disposition:home with mother Discharge instruction: per After Visit Summary and Postpartum booklet. Activity: Advance as tolerated. Pelvic rest for 6 weeks.  Diet: routine diet Future Appointments: Future Appointments  Date Time Provider [place]  11/10/2022  4:10 PM Donnamae Jude, MD [facility]   Follow up Visit:  Message sent to [facility] by Autry-Lott on 09/28/2022  Please schedule this patient for a In person postpartum visit in 6 weeks with the following provider: MD. Additional Postpartum F/U: Repeat CBC at 6 weeks PP with peripheral smear due to Metamyelovytes on CBC 07/2022 Low risk pregnancy complicated by:  late to care, NRNST, GBS, abnormal CBC, vaginal bleeding Delivery mode:  Vaginal, Spontaneous  Anticipated Birth Control:  Unsure   09/28/2022 Naaman Plummer Autry-Lott, DO

## 2022-09-28 NOTE — Procedures (Signed)
Post-Placental Nexplanon Insertion Procedure Note  Patient was identified. Informed consent was signed, signed copy in chart. A time-out was performed.    The insertion site was identified 8-10 cm (3-4 inches) from the medial epicondyle of the humerus and 3-5 cm (1.25-2 inches) posterior to (below) the sulcus (groove) between the biceps and triceps muscles of the patient's left arm and marked. The site was prepped and draped in the usual sterile fashion. Pt was prepped with alcohol swab and then injected with 1 cc of 1% lidocaine. The site was prepped with betadine. Nexplanon removed form packaging,  Device confirmed in needle, then inserted full length of needle and withdrawn per handbook instructions. Provider and patient verified presence of the implant in the woman's arm by palpation. Pt insertion site was covered with steristrips/adhesive bandage and pressure bandage. There was minimal blood loss. Patient tolerated procedure well.  Patient was given post procedure instructions and Nexplanon user card with expiration date. Condoms were recommended for STI prevention. Patient was asked to keep the pressure dressing on for 24 hours to minimize bruising. The patient verbalized understanding of the plan of care and agrees.   Lot # [id number] Expiration Date 2025/11

## 2022-09-30 ENCOUNTER — Encounter: Payer: Self-pay | Admitting: Family Medicine

## 2022-10-07 ENCOUNTER — Telehealth (HOSPITAL_COMMUNITY): Payer: Self-pay | Admitting: *Deleted

## 2022-10-07 NOTE — Telephone Encounter (Signed)
Voicemail not setup. Unable to leave message.  Odis Hollingshead, RN 10-07-2022 at 1:11pm

## 2022-11-10 ENCOUNTER — Encounter: Payer: Self-pay | Admitting: Family Medicine

## 2022-11-10 ENCOUNTER — Ambulatory Visit (INDEPENDENT_AMBULATORY_CARE_PROVIDER_SITE_OTHER): Payer: Self-pay | Admitting: Family Medicine

## 2022-11-10 DIAGNOSIS — Z1339 Encounter for screening examination for other mental health and behavioral disorders: Secondary | ICD-10-CM

## 2022-11-10 NOTE — Progress Notes (Signed)
Post Partum Visit Note  Sara Price is a 32 y.o. [phone] female who presents for a postpartum visit. She is 7 weeks postpartum following a normal spontaneous vaginal delivery.  I have fully reviewed the prenatal and intrapartum course. The delivery was at [redacted] weeks gestational weeks.  Anesthesia: none. Postpartum course has been Well. Baby is doing well. Baby is feeding by both breast and bottle - Similac . Some Bleeding. Bowel function is normal. Bladder function is normal. Patient is not sexually active. Contraception method is Nexplanon. Postpartum depression screening: negative.   The pregnancy intention screening data noted above was reviewed. Potential methods of contraception were discussed. The patient elected to proceed with No data recorded.   Edinburgh Postnatal Depression Scale - 11/10/22 1640       Edinburgh Postnatal Depression Scale:  In the Past 7 Days   I have been able to laugh and see the funny side of things. 0    I have looked forward with enjoyment to things. 0    I have blamed myself unnecessarily when things went wrong. 0    I have been anxious or worried for no good reason. 0    I have felt scared or panicky for no good reason. 0    Things have been getting on top of me. 0    I have been so unhappy that I have had difficulty sleeping. 0    I have felt sad or miserable. 0    I have been so unhappy that I have been crying. 0    The thought of harming myself has occurred to me. 0    Edinburgh Postnatal Depression Scale Total 0             Health Maintenance Due  Topic Date Due   COVID-19 Vaccine (1) Never done    The following portions of the patient's history were reviewed and updated as appropriate: allergies, current medications, past family history, past medical history, past social history, past surgical history, and problem list.  Review of Systems Pertinent items noted in HPI and remainder of comprehensive ROS otherwise  negative.  Objective:  BP 103/69   Pulse 69   Wt 157 lb (71.2 kg)   BMI 28.72 kg/m    General:  alert, cooperative, and appears stated age  Lungs: Normal effort  Heart:  regular rate and rhythm  Abdomen: soft, non-tender; bowel sounds normal; no masses,  no organomegaly        Assessment:      Normal  postpartum exam.   Plan:   Essential components of care per ACOG recommendations:  1.  Mood and well being: Patient with negative depression screening today. Reviewed local resources for support.  - Patient tobacco use? No.   - hx of drug use? No.    2. Infant care and feeding:  -Patient currently breastmilk feeding? Yes. Reviewed importance of draining breast regularly to support lactation.  -Social determinants of health (SDOH) reviewed in EPIC. No concerns  3. Sexuality, contraception and birth spacing - Patient does not want a pregnancy in the next year.  Desired family size is 3 children.  - Reviewed reproductive life planning. Reviewed contraceptive methods based on pt preferences and effectiveness.  Patient desired Hormonal Implant today.   - Discussed birth spacing of 18 months  4. Sleep and fatigue -Encouraged family/partner/community support of 4 hrs of uninterrupted sleep to help with mood and fatigue  5. Physical Recovery  - Discussed patients delivery  and complications. She describes her labor as good. - Patient had a Vaginal, no problems at delivery. Patient had no laceration. Perineal healing reviewed. Patient expressed understanding - Patient has urinary incontinence? No. - Patient is safe to resume physical and sexual activity  6.  Health Maintenance - HM due items addressed Yes - Last pap smear  Diagnosis  Date Value Ref Range Status  02/05/2020   Final   - Negative for intraepithelial lesion or malignancy (NILM)   Pap smear not done at today's visit.  -Breast Cancer screening indicated? No.   7. Chronic Disease/Pregnancy Condition follow up:  None  - PCP follow up  Reva Bores, MD [facility]
